# Patient Record
Sex: Female | Born: 1990 | Hispanic: No | Marital: Married | Smoking: Never smoker
Health system: Southern US, Community
[De-identification: ages and names within clinical notes are randomized; demographics above are authoritative.]

## PROBLEM LIST (undated history)

## (undated) ENCOUNTER — Inpatient Hospital Stay (HOSPITAL_COMMUNITY): Payer: Self-pay

---

## 2019-10-26 LAB — OB RESULTS CONSOLE TSH: TSH: 1.05

## 2019-10-26 LAB — SICKLE CELL SCREEN: Sickle Cell Screen: NEGATIVE

## 2019-12-21 LAB — OB RESULTS CONSOLE PLATELET COUNT: Platelets: 233

## 2019-12-21 LAB — OB RESULTS CONSOLE HGB/HCT, BLOOD
HCT: 35 (ref 29–41)
Hemoglobin: 11.9

## 2020-02-03 ENCOUNTER — Other Ambulatory Visit: Payer: Self-pay

## 2020-02-03 ENCOUNTER — Encounter: Payer: Self-pay | Admitting: Certified Nurse Midwife

## 2020-02-03 ENCOUNTER — Ambulatory Visit (INDEPENDENT_AMBULATORY_CARE_PROVIDER_SITE_OTHER): Payer: Self-pay | Admitting: Certified Nurse Midwife

## 2020-02-03 VITALS — BP 109/74 | HR 102 | Ht 64.96 in | Wt 135.5 lb

## 2020-02-03 DIAGNOSIS — Z3A24 24 weeks gestation of pregnancy: Secondary | ICD-10-CM

## 2020-02-03 DIAGNOSIS — Z3400 Encounter for supervision of normal first pregnancy, unspecified trimester: Secondary | ICD-10-CM | POA: Insufficient documentation

## 2020-02-03 DIAGNOSIS — Z3402 Encounter for supervision of normal first pregnancy, second trimester: Secondary | ICD-10-CM

## 2020-02-03 NOTE — Progress Notes (Signed)
History:   Sandra Thomas is a 30 y.o. G1P0000 at [redacted]w[redacted]d by LMP being seen today for her first obstetrical visit.  Her obstetrical history is not significant. Patient does intend to breastfeed. Pregnancy history fully reviewed. Pt moved here on 01/25/20 for to attend a class, she is a pharmacist, FOB is a nurse who will be traveling back and forth between here and [place] until just before the patient delivers. Prefers female providers only.   Patient reports no complaints.  HISTORY: OB History  Gravida Para Term Preterm AB Living  1 0 0 0 0 0  SAB IAB Ectopic Multiple Live Births  0 0 0 0 0    # Outcome Date GA Lbr Len/2nd Weight Sex Delivery Anes PTL Lv  1 Current             Pap is due but will be performed at the postpartum visit  No past medical history on file.  No family history on file. Social History   Tobacco Use  . Smoking status: Never Smoker  . Smokeless tobacco: Never Used  Vaping Use  . Vaping Use: Never used  Substance Use Topics  . Alcohol use: Never  . Drug use: Never   Not on File Current Outpatient Medications on File Prior to Visit  Medication Sig Dispense Refill  . CALCIUM PO Take by mouth.    . Omega-3 Fatty Acids (OMEGA 3 PO) Take by mouth.    . Prenatal Vit-Fe Fumarate-FA (MULTIVITAMIN-PRENATAL) 27-0.8 MG TABS tablet Take 1 tablet by mouth daily at 12 noon.    Marland Kitchen VITAMIN D PO Take by mouth.     No current facility-administered medications on file prior to visit.    Review of Systems Pertinent items noted in HPI and remainder of comprehensive ROS otherwise negative. Physical Exam:   Vitals:   02/03/20 1033 02/03/20 1035  BP: 109/74   Pulse: (!) 102   Weight: 135 lb 8 oz (61.5 kg)   Height:  5' 4.96" (1.65 m)   Fetal Heart Rate (bpm): 148  Uterus:  Fundal Height: 24 cm  Pelvic Exam: Perineum: Pelvic exam deferred   Vulva:    Vagina:     Cervix:    Adnexa:    Bony Pelvis: average  System: General: well-developed,  well-nourished female in no acute distress   Breasts:  normal appearance, no masses or tenderness bilaterally   Skin: normal coloration and turgor, no rashes   Neurologic: oriented, normal, negative, normal mood   Extremities: normal strength, tone, and muscle mass, ROM of all joints is normal   HEENT PERRLA, extraocular movement intact and sclera clear, anicteric   Mouth/Teeth mucous membranes moist, pharynx normal without lesions and dental hygiene good   Neck supple and no masses   Cardiovascular: regular rate and rhythm   Respiratory:  no respiratory distress, normal breath sounds   Abdomen: soft, non-tender; bowel sounds normal; no masses,  no organomegaly    Assessment:    Pregnancy: G1P0000 Patient Active Problem List   Diagnosis Date Noted  . Supervision of low-risk first pregnancy 02/03/2020     Plan:    1. Encounter for supervision of low-risk first pregnancy in second trimester - Initial labs will be drawn at 28wk visit - Continue prenatal vitamins. - Problem list reviewed and updated. - Anticipatory guidance about prenatal visits given including labs, ultrasounds, and testing. - Discussed usage of Babyscripts and virtual visits as additional source of managing and completing prenatal visits  in midst of coronavirus and pandemic.   - Encouraged to complete MyChart Registration for her ability to review results, send requests, and have questions addressed.  - The nature of [place] [facility]Faculty Practice with multiple MDs and Advanced Practice Providers was explained to patient; also emphasized that residents, students are part of our team. - Routine obstetric precautions reviewed. Encouraged to seek out care at office or emergency room [facility] preferred) for urgent and/or emergent concerns. - Reassured patient that we have virtual interpreters available at all times in the hospital - Advised that we are usually able to provide female providers,  but in times of emergency may need a female provider to see her. Pt and FOB agreeable.  2. [redacted] weeks gestation of pregnancy - Genetic Screening discussed, First trimester screen, Quad screen and NIPS: declined. - Ultrasound discussed; fetal anatomic survey: ordered. - [place] MFM OB COMP + 14 WK; Future  Return in about 4 weeks (around 03/02/2020) for IN-PERSON, LOB w GTT.    Edd Arbour, MSN, CNM, IBCLC Certified Nurse Midwife, [facility]

## 2020-02-04 ENCOUNTER — Telehealth: Payer: Self-pay

## 2020-02-04 LAB — POCT URINALYSIS DIP (DEVICE)
Bilirubin Urine: NEGATIVE
Glucose, UA: NEGATIVE mg/dL
Hgb urine dipstick: NEGATIVE
Ketones, ur: NEGATIVE mg/dL
Leukocytes,Ua: NEGATIVE
Nitrite: NEGATIVE
Protein, ur: NEGATIVE mg/dL
Specific Gravity, Urine: 1.02 (ref 1.005–1.030)
Urobilinogen, UA: 0.2 mg/dL (ref 0.0–1.0)
pH: 7 (ref 5.0–8.0)

## 2020-02-04 NOTE — Telephone Encounter (Signed)
Called pt with [place] interpreter Esra ID [phone] ; VM left with anatomy US date and time.

## 2020-02-06 ENCOUNTER — Inpatient Hospital Stay (HOSPITAL_COMMUNITY)
Admission: EM | Admit: 2020-02-06 | Discharge: 2020-02-06 | Disposition: A | Discharge status: Home / Self Care | Payer: Self-pay | Attending: Obstetrics & Gynecology | Admitting: Obstetrics & Gynecology

## 2020-02-06 DIAGNOSIS — Z3A25 25 weeks gestation of pregnancy: Secondary | ICD-10-CM | POA: Insufficient documentation

## 2020-02-06 DIAGNOSIS — O368121 Decreased fetal movements, second trimester, fetus 1: Secondary | ICD-10-CM | POA: Insufficient documentation

## 2020-02-06 DIAGNOSIS — O0932 Supervision of pregnancy with insufficient antenatal care, second trimester: Secondary | ICD-10-CM | POA: Insufficient documentation

## 2020-02-06 NOTE — ED Triage Notes (Signed)
Emergency Medicine Provider OB Triage Evaluation Note  Sandra Thomas is a 30 y.o. female, G1P0000, at [redacted]w[redacted]d gestation who presents to the emergency department with complaints of pelvic pain and decreased fetal movement. History obtained using [place] interpreter.  Review of  Systems  Positive: abdominal pain Negative: vaginal bleeding  Physical Exam  BP 125/83 (BP Location: Right Arm)   Pulse 98   Temp 97.6 F (36.4 C) (Oral)   Resp 16   LMP 08/14/2019   SpO2 100%  General: Awake, no distress  HEENT: Atraumatic  Resp: Normal effort  Cardiac: Normal rate Abd: Nondistended, nontender  MSK: Moves all extremities without difficulty Neuro: Speech clear  Medical Decision Making  Pt evaluated for pregnancy concern and is stable for transfer to MAU. Pt is in agreement with plan for transfer.  7:31 PM Discussed with MAU APP, who accepts patient in transfer. Patient concerned because her niece is waiting in the car. She is asking if we can perform an ultrasound at the bedside to monitor for fetal movement. I told her they will perform this at [facility]. Patient asking [place] to "hurry up and get me there."  Clinical Impression  No diagnosis found.     Dietrich Pates, PA-C 02/06/20 1934

## 2020-02-06 NOTE — MAU Note (Addendum)
Pt transferred from [facility] ED. States she is fine but just wants to hear FH beat. Explained we usually monitor baby on FM for awhile when pt states she has decreased FM. Pt states she feels baby move but just not like usual. States she has been sad today because her husband travels and thinks that is why she has also seen a few drops of vag blood today. Explained that is an additional reason we should monitor baby. Pt told video interpreter that she has had this happen many times in home country and everything was ok. Pt states she is fine and just wants to hear FH tones. FHTs 155. Explained just because their is normal FH rate does not mean that everything is ok. We need to further evaluate to determine fetal wellbeing. Explained if pt wants to leave she will need to sign AMA form. After talking with someone on the phone a few mins., pt wanted to sign AMA and leave. Pt signed AMA and was advised to call doctor this wk for appt. Her next appt is not until February per pt. PT stated she would definitely call her doctor this wk. Pt then left Triage

## 2020-02-17 ENCOUNTER — Other Ambulatory Visit: Payer: Self-pay | Admitting: Certified Nurse Midwife

## 2020-02-17 ENCOUNTER — Ambulatory Visit: Payer: Self-pay | Attending: Certified Nurse Midwife

## 2020-02-17 ENCOUNTER — Other Ambulatory Visit: Payer: Self-pay | Admitting: *Deleted

## 2020-02-17 ENCOUNTER — Other Ambulatory Visit: Payer: Self-pay

## 2020-02-17 DIAGNOSIS — Z3402 Encounter for supervision of normal first pregnancy, second trimester: Secondary | ICD-10-CM

## 2020-02-17 DIAGNOSIS — O0930 Supervision of pregnancy with insufficient antenatal care, unspecified trimester: Secondary | ICD-10-CM

## 2020-03-01 ENCOUNTER — Other Ambulatory Visit: Payer: Self-pay | Admitting: *Deleted

## 2020-03-01 DIAGNOSIS — Z3400 Encounter for supervision of normal first pregnancy, unspecified trimester: Secondary | ICD-10-CM

## 2020-03-02 ENCOUNTER — Ambulatory Visit (INDEPENDENT_AMBULATORY_CARE_PROVIDER_SITE_OTHER): Payer: Self-pay | Admitting: Certified Nurse Midwife

## 2020-03-02 ENCOUNTER — Other Ambulatory Visit: Payer: Self-pay

## 2020-03-02 VITALS — BP 102/69 | HR 85 | Wt 138.2 lb

## 2020-03-02 DIAGNOSIS — Z3400 Encounter for supervision of normal first pregnancy, unspecified trimester: Secondary | ICD-10-CM

## 2020-03-02 DIAGNOSIS — Z3A28 28 weeks gestation of pregnancy: Secondary | ICD-10-CM

## 2020-03-02 DIAGNOSIS — Z3403 Encounter for supervision of normal first pregnancy, third trimester: Secondary | ICD-10-CM

## 2020-03-02 NOTE — Progress Notes (Signed)
   PRENATAL VISIT NOTE  Subjective:  Sandra Thomas is a 30 y.o. G1P0000 at [redacted]w[redacted]d being seen today for ongoing prenatal care.  She is currently monitored for the following issues for this low-risk pregnancy and has Supervision of low-risk first pregnancy and Late prenatal care affecting pregnancy in second trimester on their problem list.  Patient reports no complaints.  Contractions: Not present. Vag. Bleeding: None.  Movement: Present. Denies leaking of fluid.   The following portions of the patient's history were reviewed and updated as appropriate: allergies, current medications, past family history, past medical history, past social history, past surgical history and problem list.   Objective:   Vitals:   03/02/20 0829  BP: 102/69  Pulse: 85  Weight: 138 lb 3.2 oz (62.7 kg)    Fetal Status: Fetal Heart Rate (bpm): 138 Fundal Height: 28 cm Movement: Present     General:  Alert, oriented and cooperative. Patient is in no acute distress.  Skin: Skin is warm and dry. No rash noted.   Cardiovascular: Normal heart rate noted  Respiratory: Normal respiratory effort, no problems with respiration noted  Abdomen: Soft, gravid, appropriate for gestational age.  Pain/Pressure: Absent     Pelvic: Cervical exam deferred        Extremities: Normal range of motion.  Edema: None  Mental Status: Normal mood and affect. Normal behavior. Normal judgment and thought content.   Assessment and Plan:  Pregnancy: G1P0000 at [redacted]w[redacted]d 1. Encounter for supervision of low-risk first pregnancy in third trimester - Pt doing well no complaints, wanted to know why she feels baby stretching her belly side to side - ABO/Rh; Future - Hepatitis B surface antigen - Hepatitis C Antibody - Rubella screen  2. [redacted] weeks gestation of pregnancy - Routine OB care - Baby is breech by Leopold's, advised positioning and stretches to encourage vertex position of baby  Preterm labor symptoms and general  obstetric precautions including but not limited to vaginal bleeding, contractions, leaking of fluid and fetal movement were reviewed in detail with the patient. Please refer to After Visit Summary for other counseling recommendations.   Return in about 2 weeks (around 03/16/2020) for IN-PERSON, LOB.  Future Appointments  Date Time Provider Department Center  03/17/2020  9:00 AM [facility] NURSE [facility]  03/17/2020  9:15 AM [facility] US2 [facility]    Bernerd Limbo, CNM

## 2020-03-03 LAB — CBC
Hematocrit: 35.5 % (ref 34.0–46.6)
Hemoglobin: 11.6 g/dL (ref 11.1–15.9)
MCH: 24.8 pg — ABNORMAL LOW (ref 26.6–33.0)
MCHC: 32.7 g/dL (ref 31.5–35.7)
MCV: 76 fL — ABNORMAL LOW (ref 79–97)
Platelets: 225 10*3/uL (ref 150–450)
RBC: 4.67 x10E6/uL (ref 3.77–5.28)
RDW: 14 % (ref 11.7–15.4)
WBC: 8.4 10*3/uL (ref 3.4–10.8)

## 2020-03-03 LAB — RPR: RPR Ser Ql: NONREACTIVE

## 2020-03-03 LAB — RUBELLA SCREEN: Rubella Antibodies, IGG: 20.3 index (ref >0.99)

## 2020-03-03 LAB — GLUCOSE TOLERANCE, 2 HOURS W/ 1HR
Glucose, 1 hour: 104 mg/dL (ref 65–179)
Glucose, 2 hour: 72 mg/dL (ref 65–152)
Glucose, Fasting: 72 mg/dL (ref 65–91)

## 2020-03-03 LAB — HEPATITIS C ANTIBODY: Hep C Virus Ab: <0.1 s/co ratio (ref 0.0–0.9)

## 2020-03-03 LAB — HEPATITIS B SURFACE ANTIGEN: Hepatitis B Surface Ag: NEGATIVE

## 2020-03-03 LAB — HIV ANTIBODY (ROUTINE TESTING W REFLEX): HIV Screen 4th Generation wRfx: NONREACTIVE

## 2020-03-06 ENCOUNTER — Telehealth: Payer: Self-pay

## 2020-03-06 NOTE — Telephone Encounter (Signed)
Created GFE for patient and sent via mychart.

## 2020-03-13 ENCOUNTER — Telehealth: Payer: Self-pay | Admitting: Family Medicine

## 2020-03-13 NOTE — Telephone Encounter (Signed)
Patient called to say she wanted to cancel her appointment, and she will call us back in about a month. I asked her was she leaving town, and she said no she just needed to cancel.

## 2020-03-16 ENCOUNTER — Encounter: Payer: Self-pay | Admitting: Medical

## 2020-03-17 ENCOUNTER — Ambulatory Visit: Payer: Self-pay

## 2020-03-29 ENCOUNTER — Ambulatory Visit (INDEPENDENT_AMBULATORY_CARE_PROVIDER_SITE_OTHER): Payer: Self-pay | Admitting: Certified Nurse Midwife

## 2020-03-29 ENCOUNTER — Other Ambulatory Visit: Payer: Self-pay

## 2020-03-29 VITALS — BP 113/75 | HR 97 | Wt 143.0 lb

## 2020-03-29 DIAGNOSIS — Z3A32 32 weeks gestation of pregnancy: Secondary | ICD-10-CM

## 2020-03-29 DIAGNOSIS — Z3403 Encounter for supervision of normal first pregnancy, third trimester: Secondary | ICD-10-CM

## 2020-03-29 DIAGNOSIS — N856 Intrauterine synechiae: Secondary | ICD-10-CM | POA: Insufficient documentation

## 2020-03-29 NOTE — Progress Notes (Signed)
   PRENATAL VISIT NOTE  Subjective:  Sandra Thomas is a 30 y.o. G1P0000 at [redacted]w[redacted]d being seen today for ongoing prenatal care.  She is currently monitored for the following issues for this low-risk pregnancy and has Supervision of low-risk first pregnancy and Late prenatal care affecting pregnancy in second trimester on their problem list.  Patient reports no complaints.  Contractions: Irritability. Vag. Bleeding: None.  Movement: Present. Denies leaking of fluid. Interpreter ([place]) present for entirety of visit, pt expressed frustration with her explanations throughout the visit.  The following portions of the patient's history were reviewed and updated as appropriate: allergies, current medications, past family history, past medical history, past social history, past surgical history and problem list.   Objective:   Vitals:   03/29/20 1027  BP: 113/75  Pulse: 97  Weight: 143 lb (64.9 kg)    Fetal Status: Fetal Heart Rate (bpm): 146 Fundal Height: 32 cm Movement: Present     General:  Alert, oriented and cooperative. Patient is in no acute distress.  Skin: Skin is warm and dry. No rash noted.   Cardiovascular: Normal heart rate noted  Respiratory: Normal respiratory effort, no problems with respiration noted  Abdomen: Soft, gravid, appropriate for gestational age.  Pain/Pressure: Present     Pelvic: Cervical exam deferred        Extremities: Normal range of motion.  Edema: None  Mental Status: Normal mood and affect. Normal behavior. Normal judgment and thought content.   Assessment and Plan:  Pregnancy: G1P0000 at [redacted]w[redacted]d 1. Encounter for supervision of low-risk first pregnancy in third trimester - Pt doing well, seems anxious about pregnancy - has very little support in the [place]  2. [redacted] weeks gestation of pregnancy - Routine OB care - Extensive reassurance given about health of baby, HR being normal, movement being vigorous, etc - Anticipatory guidance given about next  visit - Pt requested I be at her birth, explained that I will if I am in the hospital but gave reassurance that she will be well attended by our CNMs if I am not there.   3. Uterine synechiae - Reviewed u/s report with patient, offered genetic testing again. Pt became very upset asking why we keep offering genetic testing (also very upset with interpreter's explanations). Finally able to discern that her concern stems from genetic testing never being offered in her home country unless something is wrong with the baby or the baby has died. Explained repeatedly that we offer it to all pregnant people so we get resources in order prior to delivery, etc. Pt calmed and expressed understanding and relief. - Baby remains breech with head on mom's left side. Recommended follow up ultrasound at 36wks (pt asked to wait until 20mo of pregnancy) for growth and to confirm positioning  Preterm labor symptoms and general obstetric precautions including but not limited to vaginal bleeding, contractions, leaking of fluid and fetal movement were reviewed in detail with the patient. Please refer to After Visit Summary for other counseling recommendations.   Future Appointments  Date Time Provider Department Center  04/12/2020 10:15 AM Bernerd Limbo, CNM [facility]   Edd Arbour, CNM, MSN, IBCLC Certified Nurse Midwife, [facility]

## 2020-03-29 NOTE — Patient Instructions (Signed)
AREA PEDIATRIC/FAMILY PRACTICE PHYSICIANS  [place] ([address]) . [facility] o Chambliss, MD; Eniola, MD; Hale, MD; Hensel, MD; McDiarmid, MD; McIntyer, MD; Neal, MD; Walden, MD o [address] [place], [place] [address] o [phone] o Mon-Fri 8:30-12:30, 1:30-5:00 o Providers come to see babies at [facility] o Accepting Medicaid . [facility] o Limited providers who accept newborns: Koirala, MD; Morrow, MD; Wolters, MD o [address], [place], [place] [address] o [phone] o Mon-Fri 8:00-5:30 o Babies seen by providers at [facility] o Does NOT accept Medicaid o Please call early in hospitalization for appointment (limited availability)  . [facility] o Mulberry, MD o [address] [place], [place] [address] o [phone] o Mon, Tue, Thur, Fri 8:30-5:00, Wed 10:00-7:00 (closed 1-2pm) o Babies seen by [facility] providers o Accepting Medicaid . [facility] Pediatric[facility] o Rubin, MD o [address], [place], [place] [address] o [phone] o Mon-Fri 8:30-5:00, Sat 8:30-12:00 o Provider comes to see babies at [facility] o Accepting Medicaid o Must have been referred from current patients or contacted office prior to delivery . [facility] ([facility]) o Brown, MD; Chandler, MD; Ettefagh, MD; Grant, MD; Lester, MD; McCormick, MD; McQueen, MD; Prose, MD; Simha, MD; Stanley, MD; Stryffeler, NP; Tebben, NP o [address], [place], [place] [address] o [phone] o Mon, Tue, Thur, Fri 8:30-5:30, Wed 9:30-5:30, Sat 8:30-12:30 o Babies seen by [facility] providers o Accepting Medicaid o Only accepting infants of first-time parents or siblings of current patients o Hospital discharge coordinator will make follow-up appointment . Jack Amos o [address],  [place], [place]  [address] o [phone]   Fax - [phone] . [facility] o [address], [place], [place]  [address] o Phone - [phone]   Fax - [phone] . Shilpa Gosrani o [address], [place], [place]  [address] o [phone]  [place] ([address]) . [facility] o Bates, MD; Brassfield, MD; Cooper, Cox, MD; MD; Davis, MD; Dovico, MD; Ettefaugh, MD; Little, MD; Lowe, MD; Keiffer, MD; Melvin, MD; Sumner, MD; Williams, MD o [address], [place], [place] [address] o [phone] o Mon-Fri 8:30-5:00 (extended evenings Mon-Thur as needed), Sat-Sun 10:00-1:00 o Providers come to see babies at [facility] o Accepting Medicaid for families of first-time babies and families with all children in the household age 3 and under. Must register with office prior to making appointment (M-F only). . [facility] o Henson, NP; Knapp, MD; Lalonde, MD; Tysinger, PA o [address] [place], [place] [address] o [phone] o Mon-Fri 8:00-5:00 o Babies seen by providers at [facility] o Does NOT accept Medicaid/Commercial Insurance Only . [facility] ([facility]  o Artis, MD; Barnes, MD; Bratton, MD; Coccaro, MD; Lockett Gardner, MD; Kramer, MD; Marshall, MD; Netherton, MD; Poleto, MD; Skinner, MD o [address] [place], [place] [address] o [phone] o Mon-Fri 8:30-5:30, Sat (Oct.-Mar.) 9:00-1:00 o Babies seen by providers at [facility] o Accepting Medicaid  [place] ([address]) . [facility] o Reid, MD; Warner, MD o [address], ,  [address] o [phone] o Mon-Fri 8:30-5:00, Sat 8:30-12:00 o Providers come to see babies at [facility] o Does NOT accept Medicaid . [facility] o Becker, PA; Hagler, MD; Scifres, PA; Sun, MD; Swayne, MD o [address],  [place], [place] [address] o [phone] o Mon-Fri 8:00-5:00 o Babies seen by providers at [facility] o Does NOT accept Medicaid o Only accepting babies of parents who are patients o Please call early in hospitalization for appointment (limited availability) . [facility] o Clark, MD; Frye, MD; Kelleher, MD; Mack, NP; Miller, MD; O'Keller, MD; Patterson, NP; Pudlo, MD; Puzio, MD; Thomas, MD; Tucker, MD; Twiselton, MD o [address], [place], [place] [address] o [phone] o Mon-Fri 8:00-5:00, Sat 9:00-12:00 o Providers come to see babies at [facility] o Does NOT accept Medicaid  [place] ([address]) . [facility] o Limited providers accepting new patients: Brake, NP; Wharton, PA o [address], [place], [place] [address] o [phone] o Mon-Fri 8:00-5:00 o Babies seen by providers at [facility] o Does NOT accept Medicaid o Only accepting babies of parents who are patients o Please call early in hospitalization for appointment (limited availability) . [facility] o Gay, MD; Quinlan, MD o [address] [place], [place] [address] o [phone] (press 1 to schedule appointment) o Mon-Fri 8:00-5:00 o Providers come to see babies at [facility] o Does NOT accept Medicaid . [facility] o Mazer, MD o [address] [place], [place] [address] o [phone] o Mon-Fri 8:30-5:00 (lunch 12:30-1:00), extended hours by appointment only Wed 5:00-6:30 o Babies seen by [facility] providers o Accepting Medicaid . [facility] o Banks, MD; Jordan, MD; Koberlein, MD o [address], [place], [place] [address] o [phone] o Mon-Fri 8:00-5:00 o Babies seen by [facility] providers o Does NOT accept Medicaid . [facility] o Parker, MD; Hunter, MD; Wallace, DO o [address] [place], [place]  [address] o [phone] o Mon-Fri 8:00-5:00 o Babies seen by [facility] providers o Does NOT accept Medicaid . [facility] o Brandon, PA; Brecken, PA; Christy, NP; Dees, MD; DeClaire, MD; DeWeese, MD; Hansen, NP; Mills, NP; Parrish, NP; Smoot, NP; Summer, MD; Vapne, MD o [address] [place], [place] [address] o [phone] o Mon-Fri 8:30-5:00, Sat 10:00-1:00 o Providers come to see babies at [facility] o Does NOT accept Medicaid o Free prenatal information session Tuesdays at 4:45pm . [facility] o Bouska, MD; Gordon, PA; Jeffery, PA; Weber, PA o [address] [place] [address] o [phone] o Mon-Fri 7:30-5:30 o Babies seen by [facility] providers . [facility] o [address], [place], [place]  [address] o [phone]   Fax - [phone]  [place] ([address] & [address]) . [facility] o [place]ese, MD o [address] [place], [place] [address] o [phone] o Mon-Thur 8:00-6:00 o Providers come to see babies at [facility] o Accepting Medicaid . [facility] o Anderson, NP; Badger, MD; Beal, PA; Spencer, PA o [address] [place], [place] [address] o [phone] o Mon-Thur 7:30-7:30, Fri 7:30-4:30 o Babies seen by [facility] providers o Accepting Medicaid . [facility] o Agbuya, MD; Klett, NP; Romgoolam, MD o [address], ,  [address] o [phone] o Mon-Fri 8:30-5:00, Sat 8:30-12:00 o Providers come to see babies at [facility] o Accepting Medicaid o Must have "Meet & Greet" appointment at office prior to delivery . [facility] o McCord,   MD; Wallace, MD; Wood, MD o [address], [place], [place] [address] o [phone] o Mon-Wed 8:00-6:00, Thur-Fri 8:00-5:00, Sat 9:00-12:00 o Providers come to  see babies at [facility] o Does NOT accept Medicaid o Only accepting siblings of current patients . [facility]  o [address], [place], [place]  [address] o [phone]   Fax - [phone] . [facility] o [address], [place], [place]  [address] o [phone]   Fax - [phone]  [place] ([address]) . [facility] o Cirigliano, DO; Matthews, DO o [address] [place], [place] [address] o [phone] o Mon-Fri 7:00-5:00 o Babies seen by [facility] providers o Does NOT accept Medicaid . [facility] o Briscoe, MD; Howley, PA; Moreira, PA o [address], [place], [place] [address] o [phone] o Mon-Fri 8:00-5:00 o Babies seen by [facility] providers o Accepting Medicaid . [facility] o Boyd, MD; Church, PA; Jones, NP; Osborn, PA o [address], , [place] [address] o [phone] o Mon-Fri 8:00-5:00 o Babies seen by providers at [facility] o Accepting Medicaid  [place] ([address]) . [facility] [place] o Wendling, DO o [address] [place], [place] [address] o [phone] o Mon-Fri 8:00-5:00 o Babies seen by [facility] providers o Does NOT accept Medicaid o Limited availability, please call early in hospitalization to schedule follow-up . [facility] o Calderon, PA; Cummings, MD; Dillard, MD; Martin, PA; Olson, MD; VanDeven, PA o [address], [place], [place] [address] o [phone] o Mon-Fri 8:30-5:00, Sat 9:00-12:00 o Babies seen by providers at [facility] o Accepting Medicaid o Please register online then schedule online or call office o [email or web address] . [facility] o Hunter, NP; Kumar, MD; Martin Rogers, PA o [address], [place], [place] [address] o [phone] o Mon-Fri 8:00-5:00 o Babies seen by providers at [facility] o Accepting Medicaid . [facility] ([facility] o Carrs[place], MD; Kristi Fleenor, NP; West, MD o [address], [place], [place] [address] o [phone] o Mon-Fri 8:00-5:30, Sat&Sun by appointment (phones open at 8:30) o Babies seen by [facility] providers o Accepting Medicaid o Must be a first-time baby or sibling of current patient . [facility] [place]  o [address], [place], [place]  [address] o [phone]   Fax - [phone]  [place] ([address]) . [facility] o Brown, PA; Cowen, PA; Rice, MD; Helton, PA; Spry, MD o [address] [place], [place] [address] o [phone] o Mon-Thur 8:00-7:00, Fri 8:00-5:00, Sat 8:00-12:00, Sun 9:00-12:00 o Babies seen by [facility] providers o Accepting Medicaid . [facility] [place] o Coe-Goins, MD; Marshall, MD; Pierre-Louis, MD o [address] [place] [address], [place], [place] [address] o [phone] o Mon-Thur 8:00-5:00 o Babies seen by providers at [facility] o Accepting Medicaid . [facility] o Bratton, MD; Coe-Goins, MD; Hayes, MD; Lewis, MD; List, MD; Lott, MD; Marshall, MD; Moran, MD; O'Neal, MD; Pierre-Louis, MD; Pitonzo, MD; Scholer, MD; Spangle, MD o [address] [place], [place]   [address] o [phone] o Mon-Fri 8:00-5:30, Sat (Oct.-Mar.) 9:00-1:00 o Babies seen by providers at [facility] o Accepting Medicaid o Must fill out new patient packet, available online at [email or web address] . [facility] ([facility] o Friddle, NP; Harris, NP; Kelly, NP; Logan, MD;  Melvin, PA; Poth, MD; Ramadoss, MD; Stanton, NP o [address], [place], [place] [address] o [phone] o Mon-Thur 8:00-5:30, Fri 8:00-5:00 o Babies seen by providers at [facility] o Accepting Medicaid  [place] ([address]) . [facility] o Dixon, PA; Enoch, MD; Pickard, MD; Tapia, PA o [address], [place], [place] [address] o [phone] o Mon-Fri 8:00-5:00 o Babies seen by providers at [facility] o Accepting Medicaid   [place] ([address]) . [facility] [place] o Masneri, DO; Meyers, MD; Nelson, PA o [address], [place], [place] [address] o [phone] o Mon-Fri 8:00-5:00 o Babies seen by providers at [facility] o Does NOT accept Medicaid o Limited appointment availability, please call early in hospitalization  . [facility] [place] o Kunedd, DO; McGowen, MD o [address], [place], [place] [address] o [phone] o Mon-Fri 8:00-5:00 o Babies seen by [facility] providers o Does NOT accept Medicaid . [facility] [place] o Cameron, MD; MacDonald, MD; Michaels, PA; Nayak, MD o [address] [place] [address], [place], [place] [address] o [phone] o Mon-Fri 8:00-5:00 o After hours clinic ([address] [place], [place] [address]) [phone] Mon-Fri 5:00-8:00, Sat 12:00-6:00, Sun 10:00-4:00 o Babies seen by [facility] providers o Accepting Medicaid . [facility] [place] o [address], [place], [place]  [address] o [phone]   Fax - [phone]  [place] ([address]) .  [facility] [place] [facility] o Andy, MD o [address], [place], [place] [address] o [phone] o Mon-Fri 8:00-5:00 o Babies seen by [facility] providers o Does NOT accept Medicaid . [facility] [place] ([facility] [place]) o Eksir, MD o [address], [place], [place]  [address] o [phone] o Mon-Thur 8:00-7:00, Fri 8:00-5:00, Sat 8:00-12:00 o Babies seen by providers at [facility] o Accepting Medicaid - but does not have vaccinations in office (must be received elsewhere) o Limited availability, please call early in hospitalization  [place] ([address]) . [facility]  o Charlene Flemming, MD o [address], [place] [address] o [phone]  Fax [phone]   

## 2020-04-11 ENCOUNTER — Encounter: Payer: Self-pay | Admitting: General Practice

## 2020-04-12 ENCOUNTER — Encounter: Payer: Self-pay | Admitting: Certified Nurse Midwife

## 2020-04-14 ENCOUNTER — Other Ambulatory Visit: Payer: Self-pay | Admitting: Maternal & Fetal Medicine

## 2020-04-14 DIAGNOSIS — Z363 Encounter for antenatal screening for malformations: Secondary | ICD-10-CM

## 2020-04-20 ENCOUNTER — Ambulatory Visit: Payer: Self-pay

## 2020-04-24 ENCOUNTER — Encounter: Payer: Self-pay | Admitting: *Deleted

## 2020-04-24 ENCOUNTER — Other Ambulatory Visit: Payer: Self-pay

## 2020-04-24 ENCOUNTER — Ambulatory Visit: Payer: Self-pay | Attending: Maternal & Fetal Medicine | Admitting: *Deleted

## 2020-04-24 ENCOUNTER — Ambulatory Visit (HOSPITAL_BASED_OUTPATIENT_CLINIC_OR_DEPARTMENT_OTHER): Payer: Medicaid Other

## 2020-04-24 ENCOUNTER — Other Ambulatory Visit: Payer: Self-pay | Admitting: *Deleted

## 2020-04-24 DIAGNOSIS — O403XX0 Polyhydramnios, third trimester, not applicable or unspecified: Secondary | ICD-10-CM | POA: Insufficient documentation

## 2020-04-24 DIAGNOSIS — Z3A36 36 weeks gestation of pregnancy: Secondary | ICD-10-CM | POA: Insufficient documentation

## 2020-04-24 DIAGNOSIS — O0932 Supervision of pregnancy with insufficient antenatal care, second trimester: Secondary | ICD-10-CM

## 2020-04-24 DIAGNOSIS — O409XX0 Polyhydramnios, unspecified trimester, not applicable or unspecified: Secondary | ICD-10-CM

## 2020-04-24 DIAGNOSIS — O0933 Supervision of pregnancy with insufficient antenatal care, third trimester: Secondary | ICD-10-CM | POA: Insufficient documentation

## 2020-04-24 DIAGNOSIS — Z363 Encounter for antenatal screening for malformations: Secondary | ICD-10-CM | POA: Diagnosis not present

## 2020-04-26 ENCOUNTER — Ambulatory Visit (INDEPENDENT_AMBULATORY_CARE_PROVIDER_SITE_OTHER): Payer: Medicaid Other | Admitting: Certified Nurse Midwife

## 2020-04-26 ENCOUNTER — Other Ambulatory Visit (HOSPITAL_COMMUNITY)
Admission: RE | Admit: 2020-04-26 | Discharge: 2020-04-26 | Disposition: A | Discharge status: Home / Self Care | Payer: Self-pay | Source: Ambulatory Visit | Attending: Certified Nurse Midwife | Admitting: Certified Nurse Midwife

## 2020-04-26 ENCOUNTER — Other Ambulatory Visit: Payer: Self-pay

## 2020-04-26 VITALS — BP 122/86 | HR 92 | Wt 149.8 lb

## 2020-04-26 DIAGNOSIS — O0932 Supervision of pregnancy with insufficient antenatal care, second trimester: Secondary | ICD-10-CM

## 2020-04-26 DIAGNOSIS — O321XX0 Maternal care for breech presentation, not applicable or unspecified: Secondary | ICD-10-CM

## 2020-04-26 DIAGNOSIS — Z3403 Encounter for supervision of normal first pregnancy, third trimester: Secondary | ICD-10-CM

## 2020-04-26 DIAGNOSIS — Z23 Encounter for immunization: Secondary | ICD-10-CM

## 2020-04-26 DIAGNOSIS — Z3A36 36 weeks gestation of pregnancy: Secondary | ICD-10-CM

## 2020-04-26 DIAGNOSIS — N856 Intrauterine synechiae: Secondary | ICD-10-CM

## 2020-04-27 LAB — GC/CHLAMYDIA PROBE AMP (~~LOC~~) NOT AT ARMC
Chlamydia: NEGATIVE
Comment: NEGATIVE
Comment: NORMAL
Neisseria Gonorrhea: NEGATIVE

## 2020-04-28 NOTE — Progress Notes (Signed)
   PRENATAL VISIT NOTE  Subjective:  Sandra Thomas is a 30 y.o. G1P0000 at [redacted]w[redacted]d being seen today for ongoing prenatal care.  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  Patient reports no complaints.  Contractions: Not present. Vag. Bleeding: None.  Movement: Present. Denies leaking of fluid.   The following portions of the patient's history were reviewed and updated as appropriate: allergies, current medications, past family history, past medical history, past social history, past surgical history and problem list.   Objective:   Vitals:   04/26/20 1011  BP: 122/86  Pulse: 92  Weight: 149 lb 12.8 oz (67.9 kg)    Fetal Status: Fetal Heart Rate (bpm): 152 Fundal Height: 36 cm Movement: Present  Presentation: Complete Breech  General:  Alert, oriented and cooperative. Patient is in no acute distress.  Skin: Skin is warm and dry. No rash noted.   Cardiovascular: Normal heart rate noted  Respiratory: Normal respiratory effort, no problems with respiration noted  Abdomen: Soft, gravid, appropriate for gestational age.  Pain/Pressure: Absent     Pelvic: Cervical exam deferred       Pt did not tolerate swab collection well, very anxious and tender to very light touch  Extremities: Normal range of motion.  Edema: None  Mental Status: Normal mood and affect. Normal behavior. Normal judgment and thought content.   Assessment and Plan:  Pregnancy: G1P0000 at [redacted]w[redacted]d 1. Encounter for supervision of low-risk first pregnancy in third trimester - Pt doing well overall, feeling fetal movement  2. [redacted] weeks gestation of pregnancy - Routine OB care - Culture, beta strep (group b only) - Tdap vaccine greater than or equal to 7yo IM - GC/Chlamydia probe amp ([place])not at [facility]  3. Breech  presentation, single or unspecified fetus - Noted by Leopold's, confirmed by bedside U/S. Explained possible delivery options to patient who would like to avoid surgical delivery. - Reviewed ECV, will book next appt with Dr. Shawnie Pons for evaluation and discussion of ECV.  4. Uterine synechiae - Noted on last U/S, uterus measures appropriately but does seem tight around baby. AFI normal on previous U/S as well  Preterm labor symptoms and general obstetric precautions including but not limited to vaginal bleeding, contractions, leaking of fluid and fetal movement were reviewed in detail with the patient. Please refer to After Visit Summary for other counseling recommendations.   Return in about 1 week (around 05/03/2020) for IN-PERSON, LOB.  Future Appointments  Date Time Provider Department Center  05/04/2020  8:15 AM Reva Bores, MD [facility]  05/15/2020  9:15 AM [facility] NURSE [facility]  05/15/2020  9:30 AM [facility]3 [facility]   Edd Arbour, CNM, MSN, IBCLC Certified Nurse Midwife, [facility]

## 2020-04-30 LAB — CULTURE, BETA STREP (GROUP B ONLY): Strep Gp B Culture: NEGATIVE

## 2020-05-04 ENCOUNTER — Other Ambulatory Visit: Payer: Self-pay

## 2020-05-04 ENCOUNTER — Ambulatory Visit (INDEPENDENT_AMBULATORY_CARE_PROVIDER_SITE_OTHER): Payer: Self-pay | Admitting: Family Medicine

## 2020-05-04 VITALS — BP 121/83 | HR 87 | Wt 151.9 lb

## 2020-05-04 DIAGNOSIS — O321XX0 Maternal care for breech presentation, not applicable or unspecified: Secondary | ICD-10-CM

## 2020-05-04 DIAGNOSIS — Z3403 Encounter for supervision of normal first pregnancy, third trimester: Secondary | ICD-10-CM

## 2020-05-04 DIAGNOSIS — N856 Intrauterine synechiae: Secondary | ICD-10-CM

## 2020-05-04 NOTE — Progress Notes (Signed)
   PRENATAL VISIT NOTE  Subjective:  Sandra Thomas is a 30 y.o. G1P0000 at [redacted]w[redacted]d being seen today for ongoing prenatal care.  She is currently monitored for the following issues for this low-risk pregnancy and has Supervision of low-risk first pregnancy; Late prenatal care affecting pregnancy in second trimester; Uterine synechiae; and Breech presentation, antepartum on their problem list.  Patient reports no complaints.  Contractions: Not present. Vag. Bleeding: None.  Movement: Present. Denies leaking of fluid.   The following portions of the patient's history were reviewed and updated as appropriate: allergies, current medications, past family history, past medical history, past social history, past surgical history and problem list.   Objective:   Vitals:   05/04/20 0820  BP: 121/83  Pulse: 87  Weight: 151 lb 14.4 oz (68.9 kg)    Fetal Status: Fetal Heart Rate (bpm): 140   Movement: Present     General:  Alert, oriented and cooperative. Patient is in no acute distress.  Skin: Skin is warm and dry. No rash noted.   Cardiovascular: Normal heart rate noted  Respiratory: Normal respiratory effort, no problems with respiration noted  Abdomen: Soft, gravid, appropriate for gestational age.  Pain/Pressure: Absent     Pelvic: Cervical exam deferred        Extremities: Normal range of motion.  Edema: None  Mental Status: Normal mood and affect. Normal behavior. Normal judgment and thought content.   Assessment and Plan:  Pregnancy: G1P0000 at [redacted]w[redacted]d 1. Encounter for supervision of low-risk first pregnancy in third trimester Continue routine prenatal care.  2. Breech presentation with antenatal problem, single or unspecified fetus Given size of baby and uterine synechiae/septum her success rate is super low. Offered attempts but her husband declined this. 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  3. Uterine synechiae Still present and likely to be a barrier to ECV.  Term labor symptoms and general obstetric precautions including but not limited to vaginal bleeding, contractions, leaking of fluid and fetal movement were reviewed in detail with the patient. Please refer to After Visit Summary for other counseling recommendations.   Return in 1 week (on 05/11/2020) for [facility].  Future Appointments  Date Time Provider Department Center  05/15/2020  9:15 AM [facility] NURSE [facility]  05/15/2020  9:30 AM [facility] US3 [facility]    Reva Bores, MD

## 2020-05-04 NOTE — Patient Instructions (Signed)

## 2020-05-04 NOTE — Progress Notes (Signed)
[facility]- in person interpreter.

## 2020-05-12 ENCOUNTER — Other Ambulatory Visit: Payer: Self-pay

## 2020-05-12 ENCOUNTER — Telehealth: Payer: Self-pay | Admitting: *Deleted

## 2020-05-12 ENCOUNTER — Ambulatory Visit (INDEPENDENT_AMBULATORY_CARE_PROVIDER_SITE_OTHER): Payer: Self-pay | Admitting: Obstetrics and Gynecology

## 2020-05-12 ENCOUNTER — Encounter: Payer: Self-pay | Admitting: *Deleted

## 2020-05-12 VITALS — BP 125/82 | HR 80 | Wt 148.8 lb

## 2020-05-12 DIAGNOSIS — Z3403 Encounter for supervision of normal first pregnancy, third trimester: Secondary | ICD-10-CM

## 2020-05-12 DIAGNOSIS — O321XX0 Maternal care for breech presentation, not applicable or unspecified: Secondary | ICD-10-CM

## 2020-05-12 DIAGNOSIS — Z3A38 38 weeks gestation of pregnancy: Secondary | ICD-10-CM | POA: Insufficient documentation

## 2020-05-12 DIAGNOSIS — N856 Intrauterine synechiae: Secondary | ICD-10-CM

## 2020-05-12 NOTE — Telephone Encounter (Signed)
Call to patient with [facility] [place] [phone]. Advised c- section is scheduled for 05-20-20 at 0930, arrive 0730. Patient replied that she cant give answer now, husband is busy and they need two days to decide.  Advised will keep scheduled as is and check with her on Monday. Call back number given. Advised MD feels very important to proceed before she begins labor. Patient replied that doctor has explained that and she will let us know on Monday.   Phone update provided to Dr Donavan Foil.

## 2020-05-12 NOTE — Progress Notes (Signed)
   PRENATAL VISIT NOTE  Subjective:  Sandra Thomas is a 30 y.o. G1P0000 at [redacted]w[redacted]d being seen today for ongoing prenatal care.  She is currently monitored for the following issues for this high-risk pregnancy and has Supervision of low-risk first pregnancy; Late prenatal care affecting pregnancy in second trimester; Uterine synechiae; Breech presentation, antepartum; and [redacted] weeks gestation of pregnancy on their problem list.  Patient doing well with no acute concerns today. She reports no complaints.  Contractions: Not present. Vag. Bleeding: None.  Movement: Present. Denies leaking of fluid.   The following portions of the patient's history were reviewed and updated as appropriate: allergies, current medications, past family history, past medical history, past social history, past surgical history and problem list. Problem list updated.  Objective:   Vitals:   05/12/20 0906  BP: 125/82  Pulse: 80  Weight: 148 lb 12.8 oz (67.5 kg)    Fetal Status: Fetal Heart Rate (bpm): 138   Movement: Present     General:  Alert, oriented and cooperative. Patient is in no acute distress.  Skin: Skin is warm and dry. No rash noted.   Cardiovascular: Normal heart rate noted  Respiratory: Normal respiratory effort, no problems with respiration noted  Abdomen: Soft, gravid, appropriate for gestational age.  Pain/Pressure: Absent     Pelvic: Cervical exam deferred        Extremities: Normal range of motion.  Edema: None  Mental Status:  Normal mood and affect. Normal behavior. Normal judgment and thought content.   Leopolds and bedside u/s confirm  Persistent breech presentation Assessment and Plan:  Pregnancy: G1P0000 at [redacted]w[redacted]d  1. Encounter for supervision of low-risk first pregnancy in third trimester Pt declined ob visit next week when offered.  2. [redacted] weeks gestation of pregnancy   3. Breech presentation with antenatal problem, single or unspecified fetus Breech confirmed.   Discussed primary c section at 39.5 weeks.  Pt declined for family and cultural reasons.  Discussed goal is delivery before she goes into labor.  Pt compromised for c section at 40 weeks when her spouse will be back in town.  Surgery request has been sent.  4. Uterine synechiae   Term labor symptoms and general obstetric precautions including but not limited to vaginal bleeding, contractions, leaking of fluid and fetal movement were reviewed in detail with the patient.  Please refer to After Visit Summary for other counseling recommendations.   No follow-ups on file.   Mariel Aloe, MD Faculty Attending Center for [facility]

## 2020-05-12 NOTE — Patient Instructions (Signed)
Cesarean Delivery Cesarean birth, or cesarean delivery, is the surgical delivery of a baby through an incision in the abdomen and the uterus. This may be referred to as a C-section. This procedure may be scheduled ahead of time, or it may be done in an emergency situation. Tell a health care provider about:  Any allergies you have.  All medicines you are taking, including vitamins, herbs, eye drops, creams, and over-the-counter medicines.  Any problems you or family members have had with anesthetic medicines.  Any blood disorders you have.  Any surgeries you have had.  Any medical conditions you have.  Whether you or any members of your family have a history of deep vein thrombosis (DVT) or pulmonary embolism (PE). What are the risks? Generally, this is a safe procedure. However, problems may occur, including:  Infection.  Bleeding.  Allergic reactions to medicines.  Damage to other structures or organs.  Blood clots.  Injury to your baby. What happens before the procedure? General instructions  Follow instructions from your health care provider about eating or drinking restrictions.  If you know that you are going to have a cesarean delivery, do not shave your pubic area. Shaving before the procedure may increase your risk of infection.  Plan to have someone take you home from the hospital.  Ask your health care provider what steps will be taken to prevent infection. These may include: ? Removing hair at the surgery site. ? Washing skin with a germ-killing soap. ? Taking antibiotic medicine.  Depending on the reason for your cesarean delivery, you may have a physical exam or additional testing, such as an ultrasound.  You may have your blood or urine tested. Questions for your health care provider  Ask your health care provider about: ? Changing or stopping your regular medicines. This is especially important if you are taking diabetes medicines or blood  thinners. ? Your pain management plan. This is especially important if you plan to breastfeed your baby. ? How long you will be in the hospital after the procedure. ? Any concerns you may have about receiving blood products, if you need them during the procedure. ? Cord blood banking, if you plan to collect your baby's umbilical cord blood.  You may also want to ask your health care provider: ? Whether you will be able to hold or breastfeed your baby while you are still in the operating room. ? Whether your baby can stay with you immediately after the procedure and during your recovery. ? Whether a family member or a person of your choice can go with you into the operating room and stay with you during the procedure, immediately after the procedure, and during your recovery. What happens during the procedure?  An IV will be inserted into one of your veins.  Fluid and medicines, such as antibiotics, will be given before the surgery.  Fetal monitors will be placed on your abdomen to check your baby's heart rate.  You may be given a special warming gown to wear to keep your temperature stable.  A catheter may be inserted into your bladder through your urethra. This drains your urine during the procedure.  You may be given one or more of the following: ? A medicine to numb the area (local anesthetic). ? A medicine to make you fall asleep (general anesthetic). ? A medicine (regional anesthetic) that is injected into your back or through a small thin tube placed in your back (spinal anesthetic or epidural anesthetic). This   numbs everything below the injection site and allows you to stay awake during your procedure. If this makes you feel nauseous, tell your health care provider. Medicines will be available to help reduce any nausea you may feel.  An incision will be made in your abdomen, and then in your uterus.  If you are awake during your procedure, you may feel tugging and pulling in your  abdomen, but you should not feel pain. If you feel pain, tell your health care provider immediately.  Your baby will be removed from your uterus. You may feel more pressure or pushing while this happens.  Immediately after birth, your baby will be dried and kept warm. You may be able to hold and breastfeed your baby.  The umbilical cord may be clamped and cut during this time. This usually occurs after waiting a period of 1-2 minutes after delivery.  Your placenta will be removed from your uterus.  Your incisions will be closed with stitches (sutures). Staples, skin glue, or adhesive strips may also be applied to the incision in your abdomen.  Bandages (dressings) may be placed over the incision in your abdomen. The procedure may vary among health care providers and hospitals.   What happens after the procedure?  Your blood pressure, heart rate, breathing rate, and blood oxygen level will be monitored until you are discharged from the hospital.  You may continue to receive fluids and medicines through an IV.  You will have some pain. Medicines will be available to help control your pain.  To help prevent blood clots: ? You may be given medicines. ? You may have to wear compression stockings or devices. ? You will be encouraged to walk around when you are able.  Hospital staff will encourage and support bonding with your baby. Your hospital may have you and your baby to stay in the same room (rooming in) during your hospital stay to encourage successful bonding and breastfeeding.  You may be encouraged to cough and breathe deeply often. This helps to prevent lung problems.  If you have a catheter draining your urine, it will be removed as soon as possible after your procedure. Summary  Cesarean birth, or cesarean delivery, is the surgical delivery of a baby through an incision in the abdomen and the uterus.  Follow instructions from your health care provider about eating or  drinking restrictions before the procedure.  You will have some pain after the procedure. Medicines will be available to help control your pain.  Hospital staff will encourage and support bonding with your baby after the procedure. Your hospital may have you and your baby to stay in the same room (rooming in) during your hospital stay to encourage successful bonding and breastfeeding. This information is not intended to replace advice given to you by your health care provider. Make sure you discuss any questions you have with your health care provider. Document Revised: 09/06/2019 Document Reviewed: 07/14/2017 Elsevier Patient Education  2021 Elsevier Inc.   

## 2020-05-15 ENCOUNTER — Encounter (HOSPITAL_COMMUNITY): Payer: Self-pay

## 2020-05-15 ENCOUNTER — Ambulatory Visit: Payer: Self-pay

## 2020-05-15 ENCOUNTER — Encounter: Payer: Self-pay | Admitting: Family Medicine

## 2020-05-15 NOTE — Pre-Procedure Instructions (Signed)
Interpreter number [phone]

## 2020-05-15 NOTE — Patient Instructions (Signed)
Sandra Thomas  05/15/2020   Your procedure is scheduled on:  05/20/2020  Arrive at 0730 at Entrance C on [address] at [facility]  and [employer]. You are invited to use the FREE valet parking or use the Visitor's parking deck.  Pick up the phone at the desk and dial [phone].  Call this number if you have problems the morning of surgery: [phone]  Remember:   Do not eat food:(After Midnight) Desps de medianoche.  Do not drink clear liquids: (After Midnight) Desps de medianoche.  Take these medicines the morning of surgery with A SIP OF WATER:  none   Do not wear jewelry, make-up or nail polish.  Do not wear lotions, powders, or perfumes. Do not wear deodorant.  Do not shave 48 hours prior to surgery.  Do not bring valuables to the hospital.  [facility] is not   responsible for any belongings or valuables brought to the hospital.  Contacts, dentures or bridgework may not be worn into surgery.  Leave suitcase in the car. After surgery it may be brought to your room.  For patients admitted to the hospital, checkout time is 11:00 AM the day of              discharge.      Please read over the following fact sheets that you were given:     Preparing for Surgery

## 2020-05-15 NOTE — Pre-Procedure Instructions (Signed)
Pt understands the provider Saturday will be female.  Offered to reschedule to Friday for female.  Pt chose to keep the 4/30 plan.  She and her husband are aware of provider.

## 2020-05-15 NOTE — Telephone Encounter (Signed)
Message received from Hardie Pulley RN L&D, patient requested female physician. Advised female is available on 4-29.  Per Lauris Poag, patient completed pre-op screening and confirms 05-20-20 as scheduled.   Encounter closed.

## 2020-05-16 NOTE — Anesthesia Preprocedure Evaluation (Addendum)
Anesthesia Evaluation  Patient identified by MRN, date of birth, ID band Patient awake    Reviewed: Allergy & Precautions, NPO status , Patient's Chart, lab work & pertinent test results  History of Anesthesia Complications Negative for: history of anesthetic complications  Airway Mallampati: II  TM Distance: >3 FB Neck ROM: Full    Dental no notable dental hx.    Pulmonary neg pulmonary ROS,    Pulmonary exam normal        Cardiovascular negative cardio ROS Normal cardiovascular exam     Neuro/Psych negative neurological ROS  negative psych ROS   GI/Hepatic negative GI ROS, Neg liver ROS,   Endo/Other  negative endocrine ROS  Renal/GU negative Renal ROS  negative genitourinary   Musculoskeletal negative musculoskeletal ROS (+)   Abdominal   Peds  Hematology negative hematology ROS (+)   Anesthesia Other Findings Day of surgery medications reviewed with patient.  Reproductive/Obstetrics (+) Pregnancy (breech presentation)                            Anesthesia Physical Anesthesia Plan  ASA: II  Anesthesia Plan: Spinal   Post-op Pain Management:    Induction:   PONV Risk Score and Plan: 4 or greater and Treatment may vary due to age or medical condition, Ondansetron and Dexamethasone  Airway Management Planned: Natural Airway  Additional Equipment: None  Intra-op Plan:   Post-operative Plan:   Informed Consent: I have reviewed the patients History and Physical, chart, labs and discussed the procedure including the risks, benefits and alternatives for the proposed anesthesia with the patient or authorized representative who has indicated his/her understanding and acceptance.     Interpreter used for [employer] Discussed with: CRNA  Anesthesia Plan Comments:        Anesthesia Quick Evaluation

## 2020-05-18 ENCOUNTER — Other Ambulatory Visit: Payer: Self-pay

## 2020-05-18 ENCOUNTER — Encounter (HOSPITAL_COMMUNITY)
Admission: RE | Admit: 2020-05-18 | Discharge: 2020-05-18 | Disposition: A | Discharge status: Home / Self Care | Payer: Self-pay | Source: Ambulatory Visit | Attending: Obstetrics and Gynecology | Admitting: Obstetrics and Gynecology

## 2020-05-18 ENCOUNTER — Other Ambulatory Visit (HOSPITAL_COMMUNITY)
Admission: RE | Admit: 2020-05-18 | Discharge: 2020-05-18 | Disposition: A | Discharge status: Home / Self Care | Payer: Self-pay | Source: Ambulatory Visit | Attending: Obstetrics and Gynecology | Admitting: Obstetrics and Gynecology

## 2020-05-18 DIAGNOSIS — Z01818 Encounter for other preprocedural examination: Secondary | ICD-10-CM | POA: Diagnosis not present

## 2020-05-18 DIAGNOSIS — Z20822 Contact with and (suspected) exposure to covid-19: Secondary | ICD-10-CM | POA: Insufficient documentation

## 2020-05-18 LAB — TYPE AND SCREEN
ABO/RH(D): A POS
Antibody Screen: NEGATIVE

## 2020-05-18 LAB — CBC
HCT: 37 % (ref 36.0–46.0)
Hemoglobin: 11.6 g/dL — ABNORMAL LOW (ref 12.0–15.0)
MCH: 24.5 pg — ABNORMAL LOW (ref 26.0–34.0)
MCHC: 31.4 g/dL (ref 30.0–36.0)
MCV: 78.1 fL — ABNORMAL LOW (ref 80.0–100.0)
Platelets: 263 10*3/uL (ref 150–400)
RBC: 4.74 MIL/uL (ref 3.87–5.11)
RDW: 14.8 % (ref 11.5–15.5)
WBC: 8.3 10*3/uL (ref 4.0–10.5)
nRBC: 0 % (ref 0.0–0.2)

## 2020-05-18 LAB — SARS CORONAVIRUS 2 (TAT 6-24 HRS): SARS Coronavirus 2: NEGATIVE

## 2020-05-19 LAB — RPR: RPR Ser Ql: NONREACTIVE

## 2020-05-20 ENCOUNTER — Inpatient Hospital Stay (HOSPITAL_COMMUNITY): Payer: Medicaid Other | Admitting: Anesthesiology

## 2020-05-20 ENCOUNTER — Encounter (HOSPITAL_COMMUNITY)
Admission: RE | Disposition: A | Discharge status: Home / Self Care | Payer: Self-pay | Source: Ambulatory Visit | Attending: Obstetrics and Gynecology

## 2020-05-20 ENCOUNTER — Other Ambulatory Visit: Payer: Self-pay

## 2020-05-20 ENCOUNTER — Encounter (HOSPITAL_COMMUNITY): Payer: Self-pay | Admitting: Obstetrics and Gynecology

## 2020-05-20 ENCOUNTER — Inpatient Hospital Stay (HOSPITAL_COMMUNITY)
Admission: RE | Admit: 2020-05-20 | Discharge: 2020-05-22 | DRG: 788 | Disposition: A | Discharge status: Home / Self Care | Payer: Medicaid Other | Source: Ambulatory Visit | Attending: Obstetrics and Gynecology | Admitting: Obstetrics and Gynecology

## 2020-05-20 DIAGNOSIS — O329XX0 Maternal care for malpresentation of fetus, unspecified, not applicable or unspecified: Secondary | ICD-10-CM | POA: Diagnosis present

## 2020-05-20 DIAGNOSIS — O26893 Other specified pregnancy related conditions, third trimester: Secondary | ICD-10-CM | POA: Diagnosis present

## 2020-05-20 DIAGNOSIS — Z20822 Contact with and (suspected) exposure to covid-19: Secondary | ICD-10-CM | POA: Diagnosis present

## 2020-05-20 DIAGNOSIS — O3663X0 Maternal care for excessive fetal growth, third trimester, not applicable or unspecified: Secondary | ICD-10-CM | POA: Diagnosis present

## 2020-05-20 DIAGNOSIS — D649 Anemia, unspecified: Secondary | ICD-10-CM | POA: Diagnosis not present

## 2020-05-20 DIAGNOSIS — Z3403 Encounter for supervision of normal first pregnancy, third trimester: Secondary | ICD-10-CM

## 2020-05-20 DIAGNOSIS — O0932 Supervision of pregnancy with insufficient antenatal care, second trimester: Secondary | ICD-10-CM

## 2020-05-20 DIAGNOSIS — O321XX0 Maternal care for breech presentation, not applicable or unspecified: Principal | ICD-10-CM | POA: Diagnosis present

## 2020-05-20 DIAGNOSIS — Z3A40 40 weeks gestation of pregnancy: Secondary | ICD-10-CM

## 2020-05-20 DIAGNOSIS — O358XX0 Maternal care for other (suspected) fetal abnormality and damage, not applicable or unspecified: Secondary | ICD-10-CM | POA: Diagnosis present

## 2020-05-20 DIAGNOSIS — O9903 Anemia complicating the puerperium: Secondary | ICD-10-CM | POA: Diagnosis not present

## 2020-05-20 DIAGNOSIS — O403XX0 Polyhydramnios, third trimester, not applicable or unspecified: Secondary | ICD-10-CM | POA: Diagnosis present

## 2020-05-20 LAB — CBC
HCT: 31.6 % — ABNORMAL LOW (ref 36.0–46.0)
Hemoglobin: 10.2 g/dL — ABNORMAL LOW (ref 12.0–15.0)
MCH: 25.1 pg — ABNORMAL LOW (ref 26.0–34.0)
MCHC: 32.3 g/dL (ref 30.0–36.0)
MCV: 77.8 fL — ABNORMAL LOW (ref 80.0–100.0)
Platelets: 196 10*3/uL (ref 150–400)
RBC: 4.06 MIL/uL (ref 3.87–5.11)
RDW: 14.6 % (ref 11.5–15.5)
WBC: 14 10*3/uL — ABNORMAL HIGH (ref 4.0–10.5)
nRBC: 0 % (ref 0.0–0.2)

## 2020-05-20 LAB — CREATININE, SERUM
Creatinine, Ser: 0.45 mg/dL (ref 0.44–1.00)
GFR, Estimated: >60 mL/min (ref >60)

## 2020-05-20 SURGERY — Surgical Case
Anesthesia: Spinal

## 2020-05-20 MED ORDER — SIMETHICONE 80 MG PO CHEW
80.0000 mg | CHEWABLE_TABLET | Freq: Three times a day (TID) | ORAL | Status: DC
  Administered 2020-05-20 – 2020-05-22 (×6): 80 mg via ORAL
  Filled 2020-05-20 (×6): qty 1

## 2020-05-20 MED ORDER — LACTATED RINGERS IV SOLN: INTRAVENOUS | Status: DC | PRN

## 2020-05-20 MED ORDER — BUPIVACAINE IN DEXTROSE 0.75-8.25 % IT SOLN
INTRATHECAL | Status: DC | PRN
  Administered 2020-05-20: 1.6 mL via INTRATHECAL

## 2020-05-20 MED ORDER — NALBUPHINE HCL 10 MG/ML IJ SOLN: 5.0000 mg | Freq: Once | INTRAMUSCULAR | Status: DC | PRN

## 2020-05-20 MED ORDER — OXYTOCIN-SODIUM CHLORIDE 30-0.9 UT/500ML-% IV SOLN
INTRAVENOUS | Status: AC
  Filled 2020-05-20: qty 500

## 2020-05-20 MED ORDER — NALBUPHINE HCL 10 MG/ML IJ SOLN
5.0000 mg | INTRAMUSCULAR | Status: DC | PRN
Start: 2020-05-20 — End: 2020-05-23

## 2020-05-20 MED ORDER — DEXAMETHASONE SODIUM PHOSPHATE 10 MG/ML IJ SOLN
INTRAMUSCULAR | Status: DC | PRN
  Administered 2020-05-20: 10 mg via INTRAVENOUS

## 2020-05-20 MED ORDER — PRENATAL MULTIVITAMIN CH: 1.0000 | ORAL_TABLET | Freq: Every day | ORAL | Status: DC

## 2020-05-20 MED ORDER — DEXAMETHASONE SODIUM PHOSPHATE 10 MG/ML IJ SOLN
INTRAMUSCULAR | Status: AC
  Filled 2020-05-20: qty 1

## 2020-05-20 MED ORDER — LACTATED RINGERS IV SOLN: INTRAVENOUS | Status: DC

## 2020-05-20 MED ORDER — KETOROLAC TROMETHAMINE 30 MG/ML IJ SOLN: 30.0000 mg | Freq: Four times a day (QID) | INTRAMUSCULAR | Status: AC | PRN

## 2020-05-20 MED ORDER — FENTANYL CITRATE (PF) 100 MCG/2ML IJ SOLN: 25.0000 ug | INTRAMUSCULAR | Status: DC | PRN

## 2020-05-20 MED ORDER — PHENYLEPHRINE HCL-NACL 20-0.9 MG/250ML-% IV SOLN
INTRAVENOUS | Status: DC | PRN
  Administered 2020-05-20: 60 ug/min via INTRAVENOUS

## 2020-05-20 MED ORDER — DIPHENHYDRAMINE HCL 50 MG/ML IJ SOLN: 12.5000 mg | Freq: Four times a day (QID) | INTRAMUSCULAR | Status: DC | PRN

## 2020-05-20 MED ORDER — COCONUT OIL OIL: 1.0000 "application " | TOPICAL_OIL | Status: DC | PRN

## 2020-05-20 MED ORDER — SODIUM CHLORIDE 0.9% FLUSH: 3.0000 mL | INTRAVENOUS | Status: DC | PRN

## 2020-05-20 MED ORDER — MORPHINE SULFATE (PF) 0.5 MG/ML IJ SOLN
INTRAMUSCULAR | Status: AC
  Filled 2020-05-20: qty 10

## 2020-05-20 MED ORDER — DIBUCAINE (PERIANAL) 1 % EX OINT: 1.0000 "application " | TOPICAL_OINTMENT | CUTANEOUS | Status: DC | PRN

## 2020-05-20 MED ORDER — SODIUM CHLORIDE 0.9 % IV SOLN: INTRAVENOUS | Status: DC | PRN

## 2020-05-20 MED ORDER — SIMETHICONE 80 MG PO CHEW: 80.0000 mg | CHEWABLE_TABLET | ORAL | Status: DC | PRN

## 2020-05-20 MED ORDER — KETOROLAC TROMETHAMINE 30 MG/ML IJ SOLN
INTRAMUSCULAR | Status: AC
  Filled 2020-05-20: qty 1

## 2020-05-20 MED ORDER — MENTHOL 3 MG MT LOZG: 1.0000 | LOZENGE | OROMUCOSAL | Status: DC | PRN

## 2020-05-20 MED ORDER — ONDANSETRON HCL 4 MG/2ML IJ SOLN
INTRAMUSCULAR | Status: DC | PRN
  Administered 2020-05-20 (×2): 4 mg via INTRAVENOUS

## 2020-05-20 MED ORDER — DIPHENHYDRAMINE HCL 25 MG PO CAPS: 25.0000 mg | ORAL_CAPSULE | Freq: Four times a day (QID) | ORAL | Status: DC | PRN

## 2020-05-20 MED ORDER — POVIDONE-IODINE 10 % EX SWAB
2.0000 "application " | Freq: Once | CUTANEOUS | Status: AC
  Administered 2020-05-20: 2 via TOPICAL

## 2020-05-20 MED ORDER — CEFAZOLIN SODIUM-DEXTROSE 2-4 GM/100ML-% IV SOLN
2.0000 g | INTRAVENOUS | Status: AC
  Administered 2020-05-20: 2 g via INTRAVENOUS

## 2020-05-20 MED ORDER — PHENYLEPHRINE HCL-NACL 20-0.9 MG/250ML-% IV SOLN
INTRAVENOUS | Status: AC
  Filled 2020-05-20: qty 250

## 2020-05-20 MED ORDER — SOD CITRATE-CITRIC ACID 500-334 MG/5ML PO SOLN
30.0000 mL | ORAL | Status: AC
  Administered 2020-05-20: 30 mL via ORAL

## 2020-05-20 MED ORDER — ONDANSETRON HCL 4 MG/2ML IJ SOLN
INTRAMUSCULAR | Status: AC
  Filled 2020-05-20: qty 4

## 2020-05-20 MED ORDER — IBUPROFEN 600 MG PO TABS
600.0000 mg | ORAL_TABLET | Freq: Four times a day (QID) | ORAL | Status: DC
  Administered 2020-05-21 – 2020-05-22 (×4): 600 mg via ORAL
  Filled 2020-05-20 (×5): qty 1

## 2020-05-20 MED ORDER — ONDANSETRON HCL 4 MG/2ML IJ SOLN: 4.0000 mg | Freq: Three times a day (TID) | INTRAMUSCULAR | Status: DC | PRN

## 2020-05-20 MED ORDER — NALOXONE HCL 4 MG/10ML IJ SOLN
1.0000 ug/kg/h | INTRAVENOUS | Status: DC | PRN
  Filled 2020-05-20: qty 5

## 2020-05-20 MED ORDER — ACETAMINOPHEN 160 MG/5ML PO SOLN: 1000.0000 mg | Freq: Once | ORAL | Status: DC

## 2020-05-20 MED ORDER — WITCH HAZEL-GLYCERIN EX PADS
1.0000 | MEDICATED_PAD | CUTANEOUS | Status: DC | PRN
Start: 2020-05-20 — End: 2020-05-23

## 2020-05-20 MED ORDER — NALOXONE HCL 0.4 MG/ML IJ SOLN: 0.4000 mg | INTRAMUSCULAR | Status: DC | PRN

## 2020-05-20 MED ORDER — SODIUM CHLORIDE 0.9 % IR SOLN
Status: DC | PRN
  Administered 2020-05-20: 1

## 2020-05-20 MED ORDER — KETOROLAC TROMETHAMINE 30 MG/ML IJ SOLN
30.0000 mg | Freq: Four times a day (QID) | INTRAMUSCULAR | Status: AC
  Administered 2020-05-20 – 2020-05-21 (×3): 30 mg via INTRAVENOUS
  Filled 2020-05-20 (×4): qty 1

## 2020-05-20 MED ORDER — SENNOSIDES-DOCUSATE SODIUM 8.6-50 MG PO TABS
2.0000 | ORAL_TABLET | ORAL | Status: DC
  Administered 2020-05-20 – 2020-05-22 (×3): 2 via ORAL
  Filled 2020-05-20 (×3): qty 2

## 2020-05-20 MED ORDER — OXYTOCIN-SODIUM CHLORIDE 30-0.9 UT/500ML-% IV SOLN
INTRAVENOUS | Status: DC | PRN
  Administered 2020-05-20: 30 [IU] via INTRAVENOUS

## 2020-05-20 MED ORDER — ACETAMINOPHEN 500 MG PO TABS: 1000.0000 mg | ORAL_TABLET | Freq: Once | ORAL | Status: DC

## 2020-05-20 MED ORDER — ENOXAPARIN SODIUM 40 MG/0.4ML IJ SOSY
40.0000 mg | PREFILLED_SYRINGE | INTRAMUSCULAR | Status: DC
  Administered 2020-05-21 – 2020-05-22 (×2): 40 mg via SUBCUTANEOUS
  Filled 2020-05-20 (×2): qty 0.4

## 2020-05-20 MED ORDER — FENTANYL CITRATE (PF) 100 MCG/2ML IJ SOLN
INTRAMUSCULAR | Status: AC
  Filled 2020-05-20: qty 2

## 2020-05-20 MED ORDER — KETOROLAC TROMETHAMINE 30 MG/ML IJ SOLN
INTRAMUSCULAR | Status: DC | PRN
  Administered 2020-05-20: 30 mg via INTRAVENOUS

## 2020-05-20 MED ORDER — CEFAZOLIN SODIUM-DEXTROSE 2-4 GM/100ML-% IV SOLN
INTRAVENOUS | Status: AC
  Filled 2020-05-20: qty 100

## 2020-05-20 MED ORDER — KETOROLAC TROMETHAMINE 30 MG/ML IJ SOLN: 30.0000 mg | Freq: Once | INTRAMUSCULAR | Status: DC

## 2020-05-20 MED ORDER — ACETAMINOPHEN 500 MG PO TABS
1000.0000 mg | ORAL_TABLET | Freq: Three times a day (TID) | ORAL | Status: DC
  Administered 2020-05-21 – 2020-05-22 (×4): 1000 mg via ORAL
  Filled 2020-05-20 (×7): qty 2

## 2020-05-20 MED ORDER — SOD CITRATE-CITRIC ACID 500-334 MG/5ML PO SOLN
ORAL | Status: AC
  Filled 2020-05-20: qty 30

## 2020-05-20 MED ORDER — MORPHINE SULFATE (PF) 0.5 MG/ML IJ SOLN
INTRAMUSCULAR | Status: DC | PRN
  Administered 2020-05-20: .15 mg via INTRATHECAL

## 2020-05-20 MED ORDER — PROMETHAZINE HCL 25 MG/ML IJ SOLN: 6.2500 mg | INTRAMUSCULAR | Status: DC | PRN

## 2020-05-20 MED ORDER — OXYTOCIN-SODIUM CHLORIDE 30-0.9 UT/500ML-% IV SOLN: 2.5000 [IU]/h | INTRAVENOUS | Status: AC

## 2020-05-20 MED ORDER — TETANUS-DIPHTH-ACELL PERTUSSIS 5-2.5-18.5 LF-MCG/0.5 IM SUSY: 0.5000 mL | PREFILLED_SYRINGE | Freq: Once | INTRAMUSCULAR | Status: DC

## 2020-05-20 MED ORDER — ACETAMINOPHEN 500 MG PO TABS
1000.0000 mg | ORAL_TABLET | Freq: Four times a day (QID) | ORAL | Status: DC
  Administered 2020-05-20: 1000 mg via ORAL

## 2020-05-20 MED ORDER — OXYCODONE HCL 5 MG PO TABS: 5.0000 mg | ORAL_TABLET | ORAL | Status: DC | PRN

## 2020-05-20 MED ORDER — FENTANYL CITRATE (PF) 100 MCG/2ML IJ SOLN
INTRAMUSCULAR | Status: DC | PRN
  Administered 2020-05-20: 15 ug via INTRATHECAL

## 2020-05-20 SURGICAL SUPPLY — 39 items
BENZOIN TINCTURE PRP APPL 2/3 (GAUZE/BANDAGES/DRESSINGS) ×2 IMPLANT
CHLORAPREP W/TINT 26ML (MISCELLANEOUS) ×2 IMPLANT
CLAMP CORD UMBIL (MISCELLANEOUS) IMPLANT
CLOTH BEACON ORANGE TIMEOUT ST (SAFETY) ×2 IMPLANT
DRAPE C SECTION CLR SCREEN (DRAPES) IMPLANT
DRSG OPSITE POSTOP 4X10 (GAUZE/BANDAGES/DRESSINGS) ×2 IMPLANT
ELECT REM PT RETURN 9FT ADLT (ELECTROSURGICAL) ×2
ELECTRODE REM PT RTRN 9FT ADLT (ELECTROSURGICAL) ×1 IMPLANT
EXTRACTOR VACUUM M CUP 4 TUBE (SUCTIONS) IMPLANT
GLOVE BIO SURGEON STRL SZ7.5 (GLOVE) ×2 IMPLANT
GLOVE BIOGEL PI IND STRL 7.0 (GLOVE) ×1 IMPLANT
GLOVE BIOGEL PI INDICATOR 7.0 (GLOVE) ×1
GOWN STRL REUS W/TWL 2XL LVL3 (GOWN DISPOSABLE) ×2 IMPLANT
GOWN STRL REUS W/TWL LRG LVL3 (GOWN DISPOSABLE) ×4 IMPLANT
KIT ABG SYR 3ML LUER SLIP (SYRINGE) IMPLANT
NEEDLE HYPO 22GX1.5 SAFETY (NEEDLE) ×2 IMPLANT
NEEDLE HYPO 25X5/8 SAFETYGLIDE (NEEDLE) IMPLANT
NS IRRIG 1000ML POUR BTL (IV SOLUTION) ×2 IMPLANT
PACK C SECTION WH (CUSTOM PROCEDURE TRAY) ×2 IMPLANT
PAD OB MATERNITY 4.3X12.25 (PERSONAL CARE ITEMS) ×2 IMPLANT
PENCIL SMOKE EVAC W/HOLSTER (ELECTROSURGICAL) ×2 IMPLANT
RTRCTR C-SECT PINK 25CM LRG (MISCELLANEOUS) ×2 IMPLANT
STRIP CLOSURE SKIN 1/2X4 (GAUZE/BANDAGES/DRESSINGS) ×2 IMPLANT
STRIP CLOSURE SKIN 1/4X4 (GAUZE/BANDAGES/DRESSINGS) ×2 IMPLANT
SUT CHROMIC 1 CTX 36 (SUTURE) ×4 IMPLANT
SUT PLAIN 0 NONE (SUTURE) IMPLANT
SUT VIC AB 1 CT1 36 (SUTURE) ×2 IMPLANT
SUT VIC AB 2-0 CT1 (SUTURE) ×2 IMPLANT
SUT VIC AB 2-0 CT1 27 (SUTURE) ×1
SUT VIC AB 2-0 CT1 TAPERPNT 27 (SUTURE) ×1 IMPLANT
SUT VIC AB 3-0 CT1 27 (SUTURE) ×1
SUT VIC AB 3-0 CT1 TAPERPNT 27 (SUTURE) ×1 IMPLANT
SUT VIC AB 3-0 SH 27 (SUTURE)
SUT VIC AB 3-0 SH 27X BRD (SUTURE) IMPLANT
SUT VIC AB 4-0 KS 27 (SUTURE) ×2 IMPLANT
SYR BULB IRRIGATION 50ML (SYRINGE) IMPLANT
TOWEL OR 17X24 6PK STRL BLUE (TOWEL DISPOSABLE) ×2 IMPLANT
TRAY FOLEY W/BAG SLVR 14FR LF (SET/KITS/TRAYS/PACK) ×2 IMPLANT
WATER STERILE IRR 1000ML POUR (IV SOLUTION) ×2 IMPLANT

## 2020-05-20 NOTE — Discharge Summary (Signed)
Postpartum Discharge Summary  Date of Service updated-yes     Patient Name: Sandra Thomas DOB: 15-May-1990 MRN: [id number]  Date of admission: 05/20/2020 Delivery date:05/20/2020  Delivering provider: Janet Berlin  Date of discharge: 05/22/2020  Admitting diagnosis: Malpresentation of fetus [O32.9XX0] Cesarean delivery delivered [O82] Intrauterine pregnancy: [redacted]w[redacted]d    Secondary diagnosis:  Active Problems:   Malpresentation of fetus   Cesarean delivery delivered  Additional problems: uterine synechiae, language barrier, late prenatal care    Discharge diagnosis: Term Pregnancy Delivered                                              Post partum procedures:none Augmentation: N/A Complications: None  Hospital course: Sceduled C/S   30y.o. yo G1P0000 at 418w0das admitted to the hospital 05/20/2020 for scheduled cesarean section with the following indication:Malpresentation.Delivery details are as follows:  Membrane Rupture Time/Date: 10:20 AM ,05/20/2020   Delivery Method:C-Section, Low Transverse  Details of operation can be found in separate operative note.  Patient had an uncomplicated postpartum course.  She is ambulating, tolerating a regular diet, passing flatus, and urinating well. She c/o dysuria and hematuria on POD #2, UA negative. Patient is discharged home in stable condition on  05/22/20        Newborn Data: Birth date:05/20/2020  Birth time:10:21 AM  Gender:Female  Living status:Living  Apgars:8 ,9  Weight:3266 g     Magnesium Sulfate received: No BMZ received: No Rhophylac:N/A MMR:N/A T-DaP:Given prenatally Flu: No Transfusion:No  Physical exam  Vitals:   05/21/20 2306 05/22/20 0151 05/22/20 0634 05/22/20 1440  BP: 105/64 (!) 95/58 104/83 115/73  Pulse: 98 98 96 (!) 105  Resp:   15 17  Temp: 98.4 F (36.9 C) 98.1 F (36.7 C) 98.2 F (36.8 C) 98 F (36.7 C)  TempSrc: Axillary Oral Oral Oral  SpO2: 99% 100%    Weight:      Height:        General: alert, cooperative and no distress Lochia: appropriate Uterine Fundus: firm Incision: No significant erythema, Dressing is clean, dry, and intact, old bloody drainage marked  DVT Evaluation: No evidence of DVT seen on physical exam. Negative Homan's sign. No cords or calf tenderness. Labs: Lab Results  Component Value Date   WBC 13.4 (H) 05/21/2020   HGB 8.8 (L) 05/21/2020   HCT 27.1 (L) 05/21/2020   MCV 77.0 (L) 05/21/2020   PLT 198 05/21/2020   CMP Latest Ref Rng & Units 05/20/2020  Creatinine 0.44 - 1.00 mg/dL 0.45   Edinburgh Score: Edinburgh Postnatal Depression Scale Screening Tool 05/20/2020  I have been able to laugh and see the funny side of things. (No Data)     After visit meds:  Allergies as of 05/22/2020      Reactions   Prenatal Vitamins    Hives, rash      Medication List    TAKE these medications   CALCIUM PO Take 600 mg by mouth daily.   ferrous sulfate 325 (65 FE) MG tablet Take 1 tablet (325 mg total) by mouth every other day. Start taking on: May 23, 2020   ibuprofen 600 MG tablet Commonly known as: ADVIL Take 1 tablet (600 mg total) by mouth every 6 (six) hours.   multivitamin-prenatal 27-0.8 MG Tabs tablet Take 1 tablet by mouth 2 (two)  times daily. Baby and Me   OMEGA 3 PO Take 2,000 Units by mouth 2 (two) times daily.   oxyCODONE 5 MG immediate release tablet Commonly known as: Oxy IR/ROXICODONE Take 1 tablet (5 mg total) by mouth every 6 (six) hours as needed for up to 7 days for moderate pain, severe pain or breakthrough pain.   Vitamin D-3 125 MCG (5000 UT) Tabs Take 5,000 Units by mouth daily.      Discharge home in stable condition Infant Feeding: Breast Infant Disposition:home with mother Discharge instruction: per After Visit Summary and Postpartum booklet. Activity: Advance as tolerated. Pelvic rest for 6 weeks.  Diet: routine diet Future Appointments: incision check 1 week Follow up Visit:  Please schedule  this patient for a In person postpartum visit in 4 weeks with the following provider: Any provider. Additional Postpartum F/U:Incision check 1 week  Low risk pregnancy complicated by: none Delivery mode:  C-Section, Low Transverse  Anticipated Birth Control:  Unsure   05/22/2020 Julianne Handler, CNM

## 2020-05-20 NOTE — Anesthesia Procedure Notes (Signed)
Spinal  Patient location during procedure: OR Start time: 05/20/2020 9:55 AM End time: 05/20/2020 9:58 AM Reason for block: surgical anesthesia Staffing Performed: anesthesiologist  Anesthesiologist: Kaylyn Layer, MD Preanesthetic Checklist Completed: patient identified, IV checked, risks and benefits discussed, monitors and equipment checked, pre-op evaluation and timeout performed Spinal Block Patient position: sitting Prep: DuraPrep and site prepped and draped Patient monitoring: heart rate, continuous pulse ox and blood pressure Approach: midline Location: L3-4 Injection technique: single-shot Needle Needle type: Pencan  Needle gauge: 24 G Needle length: 10 cm Assessment Sensory level: T4 Events: CSF return Additional Notes Risks, benefits, and alternative discussed. Patient gave consent to procedure. Prepped and draped in sitting position. Clear CSF obtained after one needle pass. Positive terminal aspiration. No pain or paraesthesias with injection. Patient tolerated procedure well. Vital signs stable. Amalia Greenhouse, MD

## 2020-05-20 NOTE — Lactation Note (Signed)
This note was copied from a baby's chart. Lactation Consultation Note  Patient Name: Boy Hopelynn Gartland [id number] Date: 05/20/2020   Age:29 hours  Attempted to visit with mom but she's not in her MBU room yet; she's still in the PACU. LC order put in at 1057. Will attempt to visit at a later time, spoke to 5th floor front desk, family has their own [facility] interpreter for Arabic.  Maternal Data    Feeding    LATCH Score                    Lactation Tools Discussed/Used    Interventions    Discharge    Consult Status      Joanny Dupree Venetia Constable 05/20/2020, 12:21 PM

## 2020-05-20 NOTE — Lactation Note (Addendum)
This note was copied from a baby's chart. Lactation Consultation Note  Patient Name: Sandra Thomas [id number] Date: 05/20/2020 Reason for consult: Initial assessment;Term;Primapara;1st time breastfeeding Age:29 hours  Consultation was completed with in-person [place] translator. P1 mom had a c-section delivery. Baby is [redacted]w[redacted]d GA and 3 hours of age at time of visit; he had one stool and one void while LC and student were in room. Baby was showing signs of hunger so LC student attempted to latch in laid back position to the left breast. LC came into room and then attempted to latch baby in x-cradle to the right breast. Baby was sleepy and reluctant so [facility] student taught mom how to hand express and spoon feed. [facility] student and mom easily expressed colostrum with great volume. Mom was giving large amounts of colostrum at once, so [facility] student educated mom on only giving drops at a time to prevent choking. [facility] student reviewed what to expect in the first day of life; size of baby's stomach, feeding baby at least 8-12 times a day, baby sleeping a lot, and cluster feeding. Mom is aware of lactation services and will call if needed before follow up visit.  Mom will need to be reinforced and encouraged on placing baby deeply onto breast, positioning, how often to feed baby, and spoon feeding.  Feeding plan: 1. Skin to skin 2. Feed baby on demand 8-12 times a day in a 24 hour period 3. Hand express and spoon feed as needed 4. Rest, stay hydrated, eat balanced meals   Maternal Data Has patient been taught Hand Expression?: Yes Does the patient have breastfeeding experience prior to this delivery?: No  Feeding Mother's Current Feeding Choice: Breast Milk  LATCH Score Latch: Too sleepy or reluctant, no latch achieved, no sucking elicited.  Audible Swallowing: None  Type of Nipple: Everted at rest and after stimulation  Comfort (Breast/Nipple): Soft / non-tender  Hold (Positioning): Full assist,  staff holds infant at breast  LATCH Score: 4   Lactation Tools Discussed/Used    Interventions Interventions: Breast feeding basics reviewed;Assisted with latch;Skin to skin;Breast massage;Hand express;Adjust position;Support pillows;Position options;Expressed milk;Education  Discharge    Consult Status Consult Status: Follow-up Date: 05/21/20    Cay Schillings 05/20/2020, 3:31 PM

## 2020-05-20 NOTE — Transfer of Care (Signed)
Immediate Anesthesia Transfer of Care Note  Patient: Sandra Thomas  Procedure(s) Performed: CESAREAN SECTION (N/A )  Patient Location: PACU  Anesthesia Type:Spinal  Level of Consciousness: awake, alert  and oriented  Airway & Oxygen Therapy: Patient Spontanous Breathing  Post-op Assessment: Report given to RN and Post -op Vital signs reviewed and stable  Post vital signs: Reviewed and stable  Last Vitals:  Vitals Value Taken Time  BP 116/66 05/20/20 1105  Temp    Pulse 86 05/20/20 1105  Resp 24 05/20/20 1105  SpO2 100 % 05/20/20 1105  Vitals shown include unvalidated device data.  Last Pain:  Vitals:   05/20/20 0828  TempSrc: Oral         Complications: No complications documented.

## 2020-05-20 NOTE — Op Note (Signed)
Operative Note   SURGERY DATE: 05/20/2020  PRE-OP DIAGNOSIS:  *Pregnancy at [redacted]w[redacted]d  *Breech presentation  POST-OP DIAGNOSIS: same   PROCEDURE: primary low transverse cesarean section via pfannenstiel skin incision with double layer uterine closure  SURGEON: Surgeon(s) and Role:    * Hermina Staggers, MD - Primary    * Myriam Jacobson Arlana Pouch, MD - Assisting  ANESTHESIA: epidural  ESTIMATED BLOOD LOSS: 376  DRAINS: UOP via indwelling foley  TOTAL IV FLUIDS: crystalloid  VTE PROPHYLAXIS: SCDs to bilateral lower extremities  ANTIBIOTICS: Two grams of Cefazolin were given., within 1 hour of skin incision  SPECIMENS: placenta to L&D   COMPLICATIONS: none  INDICATIONS: Fetal malpresentation   FINDINGS: No intra-abdominal adhesions were noted. Grossly normal uterus (no synechiae noted), tubes and ovaries. clear amniotic fluid, breech female infant, weight pending gm, APGARs 9/9, intact placenta.  PROCEDURE IN DETAIL: The patient was taken to the operating room where anesthesia was administered and normal fetal heart tones were confirmed. She was then prepped and draped in the normal fashion in the dorsal supine position with a leftward tilt.  After a time out was performed, a pfannensteil skin incision was made with the scalpel and carried through to the underlying layer of fascia. The fascia was then incised at the midline and this incision was extended laterally with the mayo scissors. Attention was turned to the superior aspect of the fascial incision which was grasped with the kocher clamps x 2, tented up and the rectus muscles were dissected off bluntly. In a similar fashion the inferior aspect of the fascial incision was grasped with the kocher clamps, tented up and the rectus muscles dissected off with the mayo scissors. The rectus muscles were then separated in the midline and the peritoneum was entered bluntly. The Alexis retractor was inserted.   A low transverse hysterotomy  was made with the scalpel until the endometrial cavity was breached and the amniotic sac ruptured with the Allis clamp, yielding clear amniotic fluid. This incision was extended bluntly and the infant's head, shoulders and body were delivered from breech using routine breech maneuvers atraumatically.The cord was clamped x 2 and cut, and the infant was handed to the awaiting pediatricians, after delayed cord clamping was done.  The placenta was then gradually expressed from the uterus and then the uterus was exteriorized and cleared of all clots and debris. The hysterotomy was repaired with a running suture of 0 chromic. A second imbricating layer of 0 chromic suture was then placed.    The uterus and adnexa were then returned to the abdomen, and the hysterotomy and all operative sites were reinspected and excellent hemostasis was noted after irrigation and suction of the abdomen with warm saline.  The peritoneum was closed with a running stitch of 3-0 Vicryl. The fascia was reapproximated with 0 Vicryl in a simple running fashion bilaterally. The subcutaneous layer was then reapproximated with interrupted sutures of 2-0 plain gut, and the skin was then closed with 4-0 vicryl, in a subcuticular fashion.  The patient  tolerated the procedure well. Sponge, lap, needle, and instrument counts were correct x 2. The patient was transferred to the recovery room awake, alert and breathing independently in stable condition.   Casper Harrison, MD [facility] Fellow, [facility] for [facility], [facility]

## 2020-05-20 NOTE — H&P (Addendum)
Obstetric Preoperative History and Physical  Sandra Thomas is a 30 y.o. G1P0000 with IUP at [redacted]w[redacted]d presenting for scheduled cesarean section.  Reports good fetal movement, no bleeding, no contractions, no leaking of fluid.  No acute preoperative concerns.    Cesarean Section Indication: malpresentation: breech  Prenatal Course Source of Care: [facility]    Pregnancy complications or risks: Patient Active Problem List   Diagnosis Date Noted  . Malpresentation of fetus 05/20/2020  . [redacted] weeks gestation of pregnancy 05/12/2020  . Breech presentation, antepartum 05/04/2020  . 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 Late prenatal care affecting pregnancy in second trimester 02/06/2020  . Supervision of low-risk first pregnancy 02/03/2020   She plans to breastfeed She desires no method for postpartum contraception.   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  History reviewed. No pertinent surgical history.  OB History  Gravida Para Term Preterm AB Living  1 0 0 0 0 0  SAB IAB Ectopic Multiple Live Births  0 0 0 0 0    # Outcome Date GA Lbr Len/2nd Weight Sex Delivery Anes PTL Lv  1 Current             Social History   Socioeconomic History  . Marital status: Married    Spouse name: Not on file  . Number of children: Not on file  . Years of  education: Not on file  . Highest education level: Not on file  Occupational History  . Not on file  Tobacco Use  . Smoking status: Never Smoker  . Smokeless tobacco: Never Used  Vaping Use  . Vaping Use: Never used  Substance and Sexual Activity  . Alcohol use: Never  . Drug use: Never  . Sexual activity: Yes  Other Topics Concern  . Not on file  Social History Narrative  . Not on file   Social Determinants of Health   Financial Resource Strain: Not on file  Food Insecurity: No Food Insecurity  . Worried About Programme researcher, broadcasting/film/video in the Last Year: Never true  . Ran Out of Food in the Last Year: Never true  Transportation Needs: No Transportation Needs  . Lack of Transportation (Medical): No  . Lack of Transportation (Non-Medical): No  Physical Activity: Not on file  Stress: Not on file  Social Connections: Not on file    Family History  Problem Relation Age of Onset  . Hypertension Mother     Medications Prior to Admission  Medication Sig Dispense Refill Last Dose  . CALCIUM PO Take 600 mg by mouth daily.     . Cholecalciferol (VITAMIN D-3) 125 MCG (5000 UT) TABS Take 5,000 Units by mouth daily.     . Omega-3 Fatty Acids (OMEGA 3 PO) Take 2,000 Units by mouth 2 (two) times daily.     . Prenatal Vit-Fe Fumarate-FA (MULTIVITAMIN-PRENATAL) 27-0.8 MG TABS tablet Take 1 tablet by mouth 2 (two) times  daily. Baby and Me       Allergies  Allergen Reactions  . Prenatal Vitamins     Hives, rash    Review of Systems: Pertinent items noted in HPI and remainder of comprehensive ROS otherwise negative.  Physical Exam: BP (!) 121/93 (BP Location: Right Arm)   Pulse 92   Temp 98.8 F (37.1 C) (Oral)   Resp 18   Ht 5' 4.57" (1.64 m)   Wt 67.7 kg   LMP 08/14/2019   SpO2 99%   BMI 25.16 kg/m  FHR by Doppler: 137 bpm CONSTITUTIONAL: Well-developed, well-nourished female in no acute distress.  HENT:  Normocephalic, atraumatic,   SKIN: Skin is warm and dry. No rash  noted. Not diaphoretic. No erythema. No pallor. NEUROLGIC: Alert and oriented to person, place, and time.   PSYCHIATRIC: Normal mood and affect.  CARDIOVASCULAR: Normal heart rate noted RESPIRATORY: Effort and breath sounds normal, no problems with respiration noted ABDOMEN: Soft, nontender, nondistended, gravid.  PELVIC: Deferred MUSCULOSKELETAL: Normal range of motion. No edema and no tenderness. 2+ distal pulses.   Pertinent Labs/Studies:   Results for orders placed or performed during the hospital encounter of 05/18/20 (from the past 72 hour(s))  SARS CORONAVIRUS 2 (TAT 6-24 HRS) Nasopharyngeal Nasopharyngeal Swab     Status: None   Collection Time: 05/18/20 11:56 AM   Specimen: Nasopharyngeal Swab  Result Value Ref Range   SARS Coronavirus 2 NEGATIVE NEGATIVE    Comment: (NOTE) SARS-CoV-2 target nucleic acids are NOT DETECTED.  The SARS-CoV-2 RNA is generally detectable in upper and lower respiratory specimens during the acute phase of infection. Negative results do not preclude SARS-CoV-2 infection, do not rule out co-infections with other pathogens, and should not be used as the sole basis for treatment or other patient management decisions. Negative results must be combined with clinical observations, patient history, and epidemiological information. The expected result is Negative.  Fact Sheet for Patients: HairSlick.no  Fact Sheet for Healthcare Providers: quierodirigir.com  This test is not yet approved or cleared by the [place] FDA and  has been authorized for detection and/or diagnosis of SARS-CoV-2 by FDA under an Emergency Use Authorization (EUA). This EUA will remain  in effect (meaning this test can be used) for the duration of the COVID-19 declaration under Se ction 564(b)(1) of the Act, 21 U.S.C. section 360bbb-3(b)(1), unless the authorization is terminated or revoked sooner.  Performed at [facility], [address] [place], [place] [address]     Assessment and Plan: Sandra Thomas is a 30 y.o. G1P0000 at [redacted]w[redacted]d being admitted for scheduled cesarean section. The risks of cesarean section discussed with the patient included but were not limited to: bleeding which may require transfusion or reoperation; infection which may require antibiotics; injury to bowel, bladder, ureters or other surrounding organs; injury to the fetus; need for additional procedures including hysterectomy in the event of a life-threatening hemorrhage; placental abnormalities with subsequent pregnancies, incisional problems, thromboembolic phenomenon and other postoperative/anesthesia complications. The patient concurred with the proposed plan, giving informed written consent for the procedure. Patient has been NPO since last night she will remain NPO for procedure. Anesthesia and OR aware. Preoperative prophylactic antibiotics and SCDs ordered on call to the OR. To OR when ready.   Pregnancy Complications:   -uterine synechiae  -fetal malpresentation  -polyhydramnios  -LGA   Contraception: declines Circumcision: yes   Casper Harrison, MD [facility] Family Medicine Fellow, [facility], [facility]   OB Attending. Bedside U/S confirmed breech presentation. Will proceed to c section. R/B/Post op care reviewed as noted above.  Nettie Elm, MD, [facility]

## 2020-05-20 NOTE — Anesthesia Postprocedure Evaluation (Signed)
Anesthesia Post Note  Patient: Sandra Thomas  Procedure(s) Performed: CESAREAN SECTION (N/A )     Patient location during evaluation: PACU Anesthesia Type: Spinal Level of consciousness: awake and alert and oriented Pain management: pain level controlled Vital Signs Assessment: post-procedure vital signs reviewed and stable Respiratory status: spontaneous breathing, nonlabored ventilation and respiratory function stable Cardiovascular status: blood pressure returned to baseline Postop Assessment: no apparent nausea or vomiting, spinal receding, no headache and no backache Anesthetic complications: no   No complications documented.  Last Vitals:  Vitals:   05/20/20 1145 05/20/20 1200  BP: (!) 126/93 118/82  Pulse: 84 82  Resp: (!) 22 15  Temp: 36.6 C   SpO2: 100% 100%    Last Pain:  Vitals:   05/20/20 1215  TempSrc:   PainSc: 2    Pain Goal:                Epidural/Spinal Function Cutaneous sensation: Tingles (05/20/20 1215), Patient able to flex knees: Yes (05/20/20 1215), Patient able to lift hips off bed: Yes (05/20/20 1215), Back pain beyond tenderness at insertion site: No (05/20/20 1215), Progressively worsening motor and/or sensory loss: No (05/20/20 1215), Bowel and/or bladder incontinence post epidural: No (05/20/20 1215)  Kaylyn Layer

## 2020-05-21 DIAGNOSIS — D649 Anemia, unspecified: Secondary | ICD-10-CM

## 2020-05-21 DIAGNOSIS — O9903 Anemia complicating the puerperium: Secondary | ICD-10-CM

## 2020-05-21 LAB — CBC
HCT: 27.1 % — ABNORMAL LOW (ref 36.0–46.0)
Hemoglobin: 8.8 g/dL — ABNORMAL LOW (ref 12.0–15.0)
MCH: 25 pg — ABNORMAL LOW (ref 26.0–34.0)
MCHC: 32.5 g/dL (ref 30.0–36.0)
MCV: 77 fL — ABNORMAL LOW (ref 80.0–100.0)
Platelets: 198 10*3/uL (ref 150–400)
RBC: 3.52 MIL/uL — ABNORMAL LOW (ref 3.87–5.11)
RDW: 14.6 % (ref 11.5–15.5)
WBC: 13.4 10*3/uL — ABNORMAL HIGH (ref 4.0–10.5)
nRBC: 0 % (ref 0.0–0.2)

## 2020-05-21 MED ORDER — FERROUS SULFATE 325 (65 FE) MG PO TABS
325.0000 mg | ORAL_TABLET | ORAL | Status: DC
  Administered 2020-05-21: 325 mg via ORAL
  Filled 2020-05-21: qty 1

## 2020-05-21 NOTE — Lactation Note (Signed)
This note was copied from a baby's chart. Lactation Consultation Note  Patient Name: Sandra Thomas OMVEH'M Date: 05/21/2020 Reason for consult: Follow-up assessment Age:29 hours   LC Follow Up Visit:  Per RN, mother sleeping.  She will call later when mother awakens for a follow up visit.   Maternal Data    Feeding    LATCH Score                    Lactation Tools Discussed/Used    Interventions    Discharge    Consult Status Consult Status: Follow-up Date: 05/21/20 Follow-up type: In-patient    Dora Sims 05/21/2020, 2:46 PM

## 2020-05-21 NOTE — Progress Notes (Signed)
Subjective: Postpartum Day 1: Cesarean Delivery Patient reports incisional pain, + flatus and no problems voiding.    Objective: Vital signs in last 24 hours: Temp:  [96.7 F (35.9 C)-98.8 F (37.1 C)] 98.4 F (36.9 C) (05/01 0616) Pulse Rate:  [69-105] 86 (05/01 0616) Resp:  [14-23] 14 (05/01 0616) BP: (98-132)/(58-93) 98/58 (05/01 0616) SpO2:  [97 %-100 %] 97 % (05/01 0616)  Physical Exam:  General: alert, cooperative and no distress Lochia: appropriate Uterine Fundus: firm Incision: bleeding on honey comb dressing marked on 4/30 and no additional bleeding today on 5/1 DVT Evaluation: No evidence of DVT seen on physical exam.  Recent Labs    05/20/20 1416 05/21/20 0441  HGB 10.2* 8.8*  HCT 31.6* 27.1*    Assessment/Plan: Status post Cesarean section. Doing well postoperatively.  Hgb dropped from 10.2 to 8.8, pt asymptomatic Add ferrous sulfate every other day to start now Continue current care.   Sharen Counter 05/21/2020, 10:31 AM

## 2020-05-21 NOTE — Lactation Note (Signed)
This note was copied from a baby's chart. Lactation Consultation Note  Patient Name: Boy Daphene Chisholm [id number] Date: 05/21/2020 Reason for consult: Follow-up assessment;Primapara;1st time breastfeeding;Term Age:29 hours  P1 mother whose infant is now 35 hours old.  This is a term baby at 40+0 weeks.  Baby was asleep STS on mother's chest when I arrived; he was bathed 1/2 hour ago.  Father is the interpreter for mother.  Father reported that baby is not latching and mother needs assistance.  She has been attempting to breast feed but has not been successful.  She is supplementing with Enfamil 20 calorie formula.  Offered to return after the hour of STS is completed and parents will have RN call me at that time.  Father stated that mother's nipples are flat.  She has a manual pump at bedside and reminded her to pre-pump prior to latching.  I will observe and provide further education when they call for assistance.  RN updated.    Maternal Data    Feeding Mother's Current Feeding Choice: Breast Milk  LATCH Score                    Lactation Tools Discussed/Used    Interventions    Discharge    Consult Status Consult Status: Follow-up Date: 05/21/20 Follow-up type: In-patient    Tirsa Gail R Jazzman Loughmiller 05/21/2020, 5:08 PM

## 2020-05-21 NOTE — Progress Notes (Signed)
Patient declined 0600 medications due to wanting rest per patient.

## 2020-05-21 NOTE — Lactation Note (Signed)
This note was copied from a baby's chart. Lactation Consultation Note  Patient Name: Sandra Thomas AVWUJ'W Date: 05/21/2020 Reason for consult: Follow-up assessment;Primapara;1st time breastfeeding;Term Age:29 hours   LC Follow Up Visit:  Parents requested latch assistance.  Baby fussy and ready to eat when I arrived.  Asked mother to demonstrate hand expression and I finger fed drops of colostrum back to baby.  Mother interested in doing the cradle hold, however, I suggested the cross cradle hold with an explanation as to why this is more beneficial and mother willing to try.  Attempted to latch in this position but baby fussy and pushed back from the breast.  Offered to do the football hold and he latched much better.  Demonstrated breast compressions and how to latch deeply.  Baby began gulping at the breast and fed actively for 5 minutes before pushing back off the breast.  Demonstrated effective burping and baby was very calm and alert.  Swaddled baby for parents and he remained calm.  Educated on feeding cues, latching and how to be know when baby is finished feeding.  Parents appreciative.  Suggested they call for assistance tonight with latching as needed.  RN updated.   Maternal Data    Feeding Mother's Current Feeding Choice: Breast Milk and Formula  LATCH Score Latch: Grasps breast easily, tongue down, lips flanged, rhythmical sucking.  Audible Swallowing: Spontaneous and intermittent  Type of Nipple: Everted at rest and after stimulation  Comfort (Breast/Nipple): Soft / non-tender  Hold (Positioning): Assistance needed to correctly position infant at breast and maintain latch.  LATCH Score: 9   Lactation Tools Discussed/Used    Interventions Interventions: Breast feeding basics reviewed;Assisted with latch;Skin to skin;Hand express;Breast compression;Adjust position;Hand pump;Position options;Support pillows;Education  Discharge    Consult Status Consult  Status: Follow-up Date: 05/22/20 Follow-up type: In-patient    Tzirel Leonor R Maire Govan 05/21/2020, 5:50 PM

## 2020-05-21 NOTE — Lactation Note (Signed)
This note was copied from a baby's chart. Lactation Consultation Note  Patient Name: Sandra Thomas Date: 05/21/2020 Reason for consult: 1st time breastfeeding;Term;Follow-up assessment;Mother's request;Difficult latch Age:29 hours P1, term female infant with signed Family  Interpreter. LC entered room, infant cuing to breastfeed, per mom, infant  has not been sustaining latch, he is on and off  the breast  most feedings are  5 minutes or less in length. Mom has breast pump to pre-pump breast prior to latching infant due to having flat nipples. LC observed infant has tendency raise tongue to roof of mouth and suck in bottom chin, LC did suck training with infant on her gloved finger and lower jaw excursions to get infant to lower jaw and drop tongue in downward position to latch at the breast. Infant latches better in a cross cradle hold, after multiple attempts infant started sustaining latch and breastfeed for 10 minutes. LC worked with mom on hand positioning of breast  and avoid stoking infant's head and face when infant is latched at the breast. Mom hand expressed 6 mls of colostrum that was given to infant by spoon after infant was latched at the breast. RN will give mom breast shells to wear in bra during the day and not at night to help evert  mom's nipple shaft out more. Parents understand that infant should breastfeed according to hunger cues, 8 to 12 or more times within 24 hours, STS. Mom knows to call RN or [facility] for assistance with latch if needed. LC praised mom on her breastfeeding efforts tonight.    Maternal Data    Feeding Mother's Current Feeding Choice: Breast Milk  LATCH Score Latch: Repeated attempts needed to sustain latch, nipple held in mouth throughout feeding, stimulation needed to elicit sucking reflex.  Audible Swallowing: A few with stimulation  Type of Nipple: Flat  Comfort (Breast/Nipple): Soft / non-tender  Hold (Positioning): Assistance  needed to correctly position infant at breast and maintain latch.  LATCH Score: 6   Lactation Tools Discussed/Used    Interventions Interventions: Assisted with latch;Skin to skin;Breast massage;Hand express;Adjust position;Support pillows;Breast compression;Position options;Expressed milk;Hand pump;Education  Discharge Pump: Personal WIC Program: No  Consult Status Consult Status: Follow-up Date: 05/21/20 Follow-up type: In-patient    Danelle Earthly 05/21/2020, 2:42 AM

## 2020-05-22 DIAGNOSIS — O0933 Supervision of pregnancy with insufficient antenatal care, third trimester: Secondary | ICD-10-CM

## 2020-05-22 DIAGNOSIS — Z3A40 40 weeks gestation of pregnancy: Secondary | ICD-10-CM

## 2020-05-22 DIAGNOSIS — O329XX0 Maternal care for malpresentation of fetus, unspecified, not applicable or unspecified: Secondary | ICD-10-CM

## 2020-05-22 LAB — URINALYSIS, ROUTINE W REFLEX MICROSCOPIC
Bilirubin Urine: NEGATIVE
Glucose, UA: NEGATIVE mg/dL
Hgb urine dipstick: NEGATIVE
Ketones, ur: NEGATIVE mg/dL
Leukocytes,Ua: NEGATIVE
Nitrite: NEGATIVE
Protein, ur: NEGATIVE mg/dL
Specific Gravity, Urine: 1.01 (ref 1.005–1.030)
pH: 7 (ref 5.0–8.0)

## 2020-05-22 MED ORDER — OXYCODONE HCL 5 MG PO TABS: 5.0000 mg | ORAL_TABLET | Freq: Four times a day (QID) | ORAL | 0 refills | Status: AC | PRN

## 2020-05-22 MED ORDER — IBUPROFEN 600 MG PO TABS: 600.0000 mg | ORAL_TABLET | Freq: Four times a day (QID) | ORAL | 0 refills | Status: DC

## 2020-05-22 MED ORDER — FERROUS SULFATE 325 (65 FE) MG PO TABS: 325.0000 mg | ORAL_TABLET | ORAL | 1 refills | Status: DC

## 2020-05-22 NOTE — Lactation Note (Addendum)
This note was copied from a baby's chart. Lactation Consultation Note  Patient Name: Sandra Thomas NFAOZ'H Date: 05/22/2020 Reason for consult: Follow-up assessment Age:29 hours   Arabic Mother , Father reports that he will Interpret all LC teaching.  Mother report that, " infant is not getting the nipple". Discussed infants weight loss and infants need to get the entire nipple and beyond to get enough milk, listen for swallows and good jaw movement. Father reports that mother doesn't want to give any formula for supplement .  Mother began hand expressing in a spoon.  She expressed  approx 8 ml and then another 6 ml.   Assist mother with latching infant on the Rt breast.  Infant on and off for several mins.  Rt nipple short and bifurcated. Mother was fit with a #24 NS . Instruct in the use of the Nipple shield and application.  Infant sustained latch for 10 mins with obs milk in the shield.  Mother has DEBP at home. She was instructed to hand exp before and after each feeding.  Cue base feed Discussed cluster feeding.  Lots of teaching with parents. Mother to pump for 15 mins on each breast with hand pump.  Discussed treatment and prevention of engorgement.   Plan of Care :  Breastfeed infant with feeding cues Supplement infant with ebm/DBM according to supplemental guidelines. Pump using a DEBP after each feeding for 15-20 mins.   Mother to continue to cue base feed infant and feed at least 8-12 times or more in 24 hours and advised to allow for cluster feeding infant as needed.  Mother to continue to due STS. Mother is aware of available LC services at [facility], BFSG'S, OP Dept, and phone # for questions or concerns about breastfeeding.  Mother receptive to all teaching and plan of care.     Maternal Data    Feeding Mother's Current Feeding Choice: Breast Milk  LATCH Score Latch: Grasps breast easily, tongue down, lips flanged, rhythmical sucking.  Audible Swallowing:  A few with stimulation (obs milk in the shield)  Type of Nipple: Everted at rest and after stimulation (Rt breast short, nipple biforcated)  Comfort (Breast/Nipple): Filling, red/small blisters or bruises, mild/mod discomfort  Hold (Positioning): Assistance needed to correctly position infant at breast and maintain latch.  LATCH Score: 7   Lactation Tools Discussed/Used Tools: Nipple Shields Nipple shield size: 24  Interventions Interventions: Assisted with latch;Skin to skin;Hand express;Breast compression;Adjust position;Support pillows;Position options;Hand pump  Discharge Pump: Manual  Consult Status Consult Status: Follow-up Date: 05/22/20 Follow-up type: In-patient    Stevan Born [facility] 05/22/2020, 11:16 AM

## 2021-02-20 IMAGING — US US MFM OB DETAIL+14 WK
1 series · 13 of 28 positions shown · non-contrast
Comparison: none

[Series 1: us mfm ob detail+14 wk · 166 acquisitions, 13 frames shown]
[im 7/166]
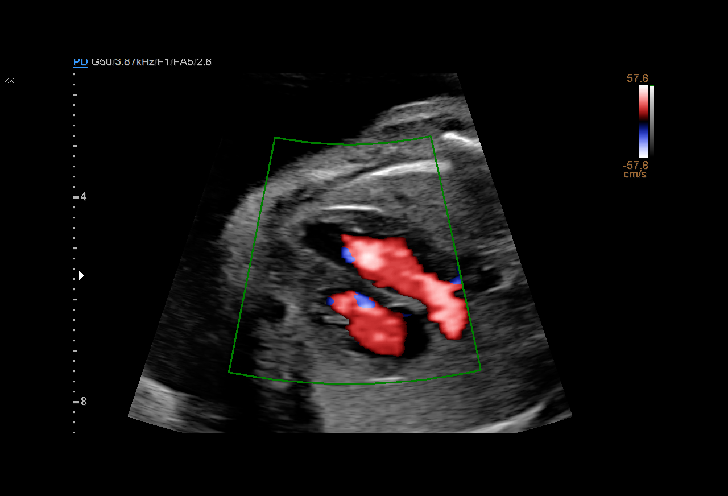
[im 19/166]
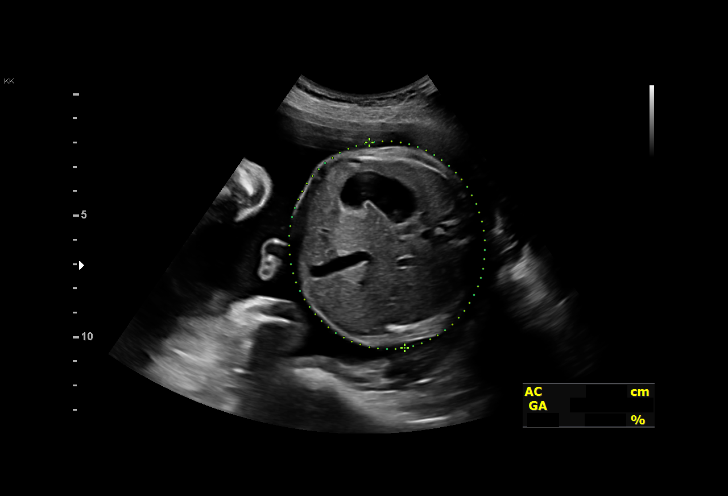
[im 31/166]
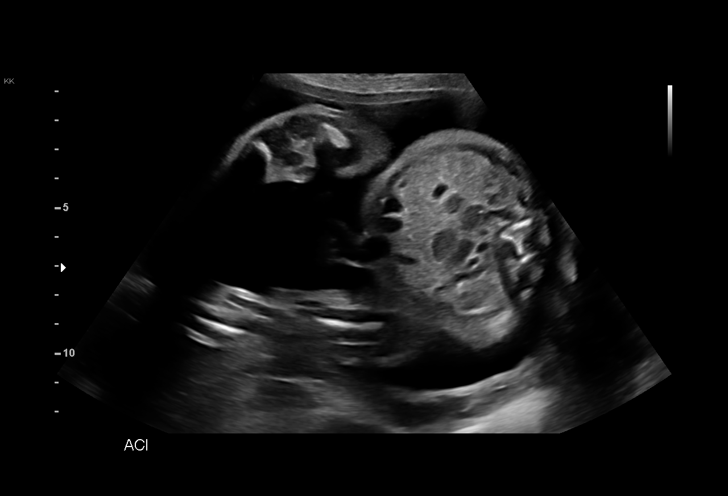
[im 43/166]
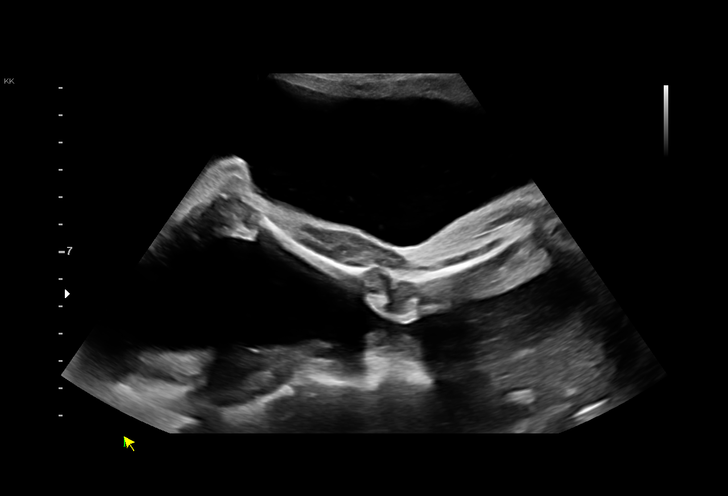
[im 56/166]
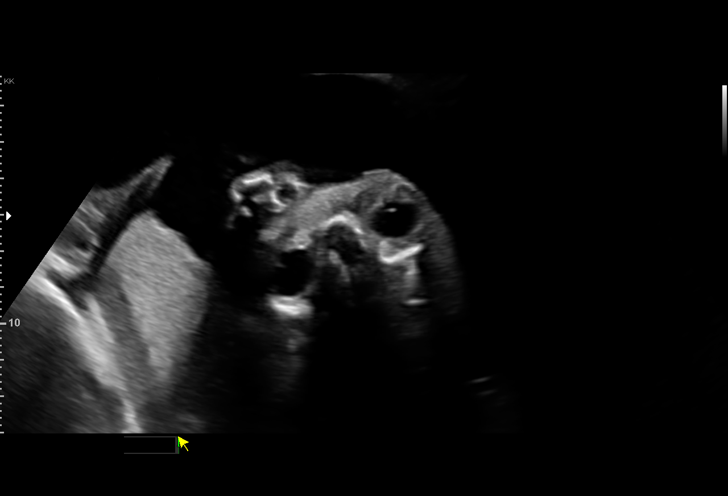
[im 68/166]
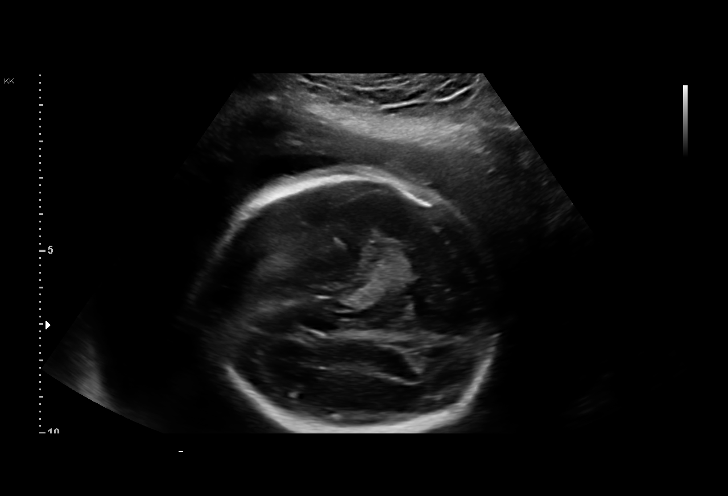
[im 86/166]
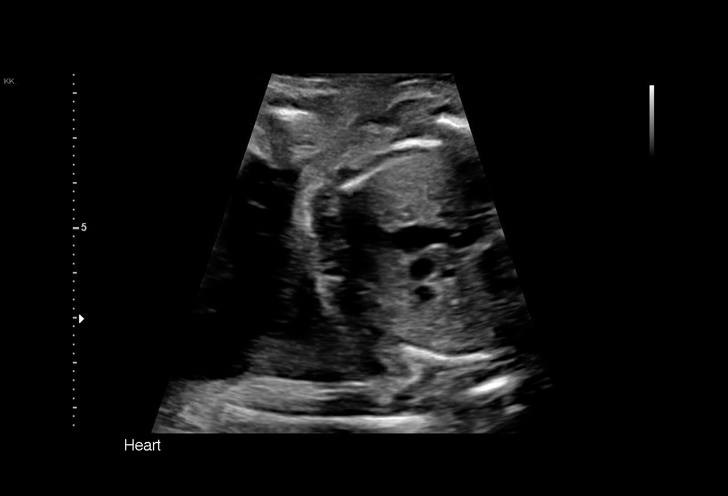
[im 98/166]
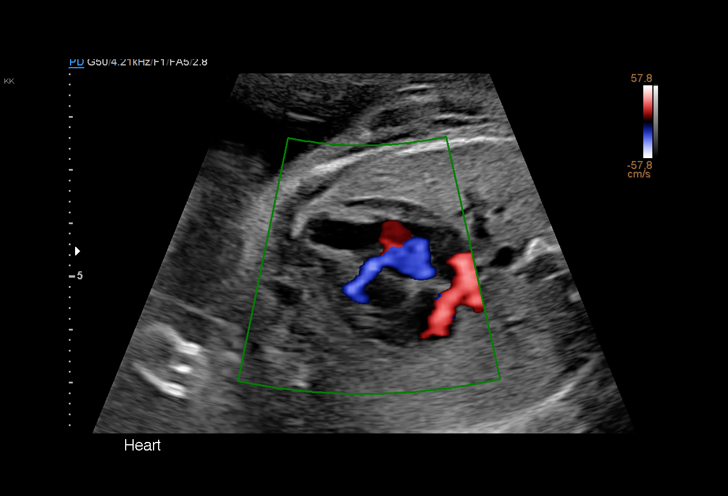
[im 111/166]
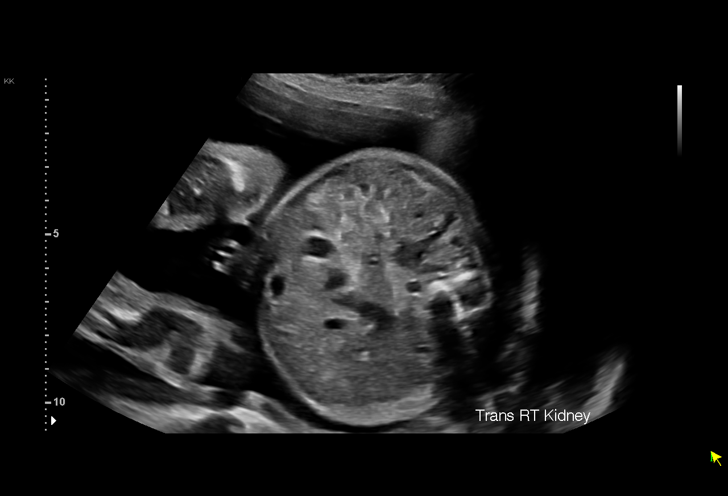
[im 123/166]
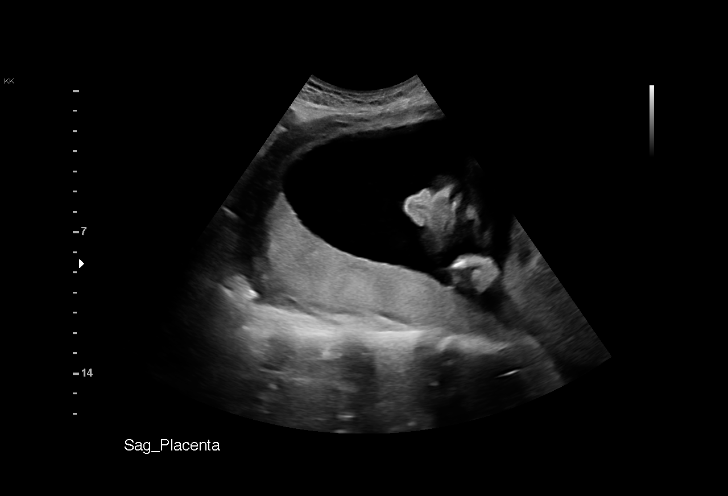
[im 135/166]
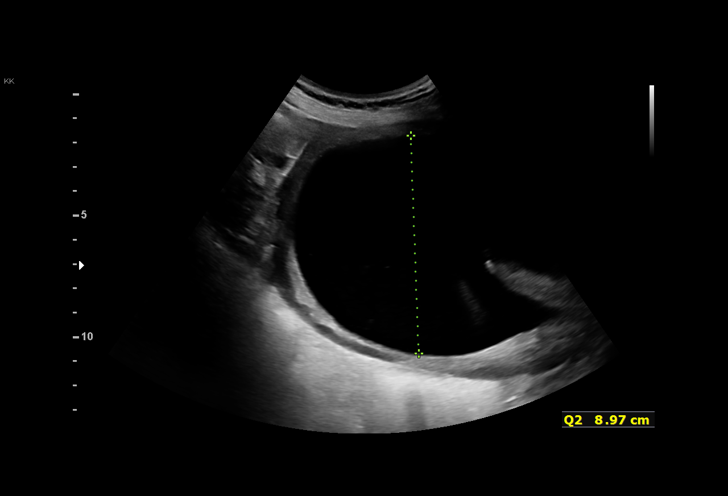
[im 147/166]
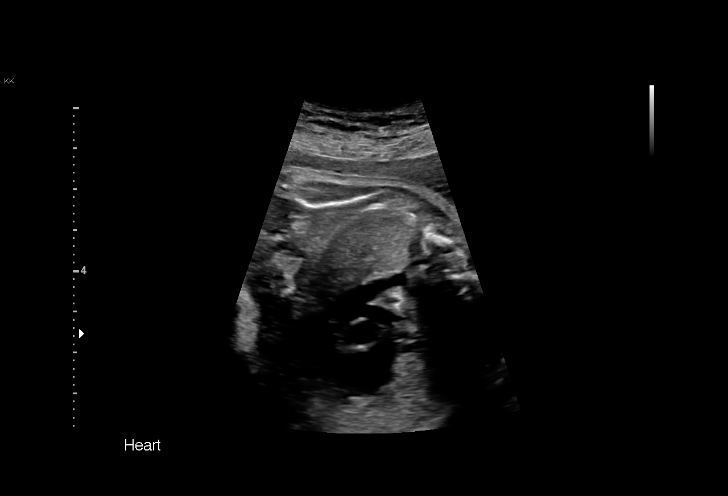
[im 159/166]
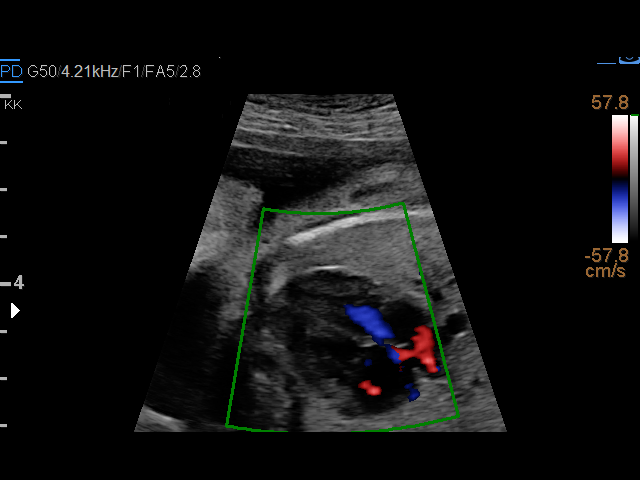

[13 of 28 positions shown; findings below may reference images not displayed]

[REDACTED]
                                                            Healthcare
                   WALKER CNM

Indications

 26 weeks gestation of pregnancy
 Encounter for antenatal screening for
 malformations
 Late to prenatal care, third trimester
 Uterine abnormality during pregnancy
 (Synechiae)
Fetal Evaluation

 Num Of Fetuses:         1
 Fetal Heart Rate(bpm):  157
 Cardiac Activity:       Observed
 Presentation:           Breech
 Placenta:               Posterior
 P. Cord Insertion:      Visualized

 Amniotic Fluid
 AFI FV:      Within normal limits

 AFI Sum(cm)     %Tile       Largest Pocket(cm)
 23.13           96

 RUQ(cm)       RLQ(cm)       LUQ(cm)        LLQ(cm)

Biometry
 BPD:      73.7  mm     G. Age:  29w 4d         99  %    CI:        79.58   %    70 - 86
                                                         FL/HC:      19.4   %    18.6 -
 HC:      261.1  mm     G. Age:  28w 3d         78  %    HC/AC:      1.00        1.05 -
 AC:      260.5  mm     G. Age:  30w 1d       > 99  %    FL/BPD:     68.7   %    71 - 87
 FL:       50.6  mm     G. Age:  27w 1d         49  %    FL/AC:      19.4   %    20 - 24
 HUM:      46.7  mm     G. Age:  27w 4d         64  %

 Est. FW:    1418  gm    2 lb 14 oz    > 99  %
OB History

 Gravidity:    1
Gestational Age

 LMP:           26w 5d        Date:  08/14/19                 EDD:   05/20/20
 U/S Today:     28w 6d                                        EDD:   05/05/20
 Best:          26w 5d     Det. By:  LMP  (08/14/19)          EDD:   05/20/20
Anatomy

 Cranium:               Appears normal         Aortic Arch:            Appears normal
 Cavum:                 Appears normal         Ductal Arch:            Appears normal
 Ventricles:            Appears normal         Diaphragm:              Appears normal
 Choroid Plexus:        Appears normal         Stomach:                Appears normal, left
                                                                       sided
 Cerebellum:            Appears normal         Abdomen:                Appears normal
 Posterior Fossa:       Appears normal         Abdominal Wall:         Appears nml (cord
                                                                       insert, abd wall)
 Nuchal Fold:           Not applicable (>20    Cord Vessels:           Appears normal (3
                        wks GA)                                        vessel cord)
 Face:                  Appears normal         Kidneys:                Appear normal
                        (orbits and profile)
 Lips:                  Appears normal         Bladder:                Appears normal
 Thoracic:              Appears normal         Spine:                  Not well visualized
 Heart:                 Appears normal         Upper Extremities:      Appears normal
                        (4CH, axis, and
                        situs)
 RVOT:                  Appears normal         Lower Extremities:      Appears normal
 LVOT:                  Appears normal

 Other:  Fetus a male. Heels visualized. Technically difficult due to advanced
         GA and fetal position.
Cervix Uterus Adnexa

 Uterus
 Intrauterine synechiae seen.
Impression

 Single intrauterine pregnancy here for a detailed anatomy
 due to late transfer of care with suspected ventricular septal
 defect and uterine septum.
 Normal anatomy with measurements consistent with dates
 There is good fetal movement and amniotic fluid volume
 Suboptimal views of the fetal anatomy were obtained
 secondary to fetal position.

 I reviewed today's study and recommend serial growth, fetal
 echocardiogram. Ms. SIGFREDO denied a referral to the
 pediatric cardiologist. I discussed that we did not observe a
 ventricular septal defect. We interrograted the heart in the 4
 chamber apical view as well as the subcostal view. We did
 not observe any markers of aneuploidy and discussed the
 option of NIPS testing. She declined.

 In addition, we observed a uterine synechiae that was without
 color flow and the fetus freely moved around this area.

 Lastly, we recommended follow up in 4 weeks to assess fetal
 growth , the heart and trend dates as she is measuring two
 weeks ahead. She conveyed that she will likely not return for
 financial reasons. I provided an appointment slip
 nevertheless.
Recommendations

 Follow up growth in 4 weeks.

## 2021-11-01 LAB — OB RESULTS CONSOLE PLATELET COUNT: Platelets: 297

## 2021-11-01 LAB — OB RESULTS CONSOLE HGB/HCT, BLOOD
HCT: 34 (ref 29–41)
Hemoglobin: 10.7

## 2021-11-04 LAB — OB RESULTS CONSOLE RUBELLA ANTIBODY, IGM: Rubella: IMMUNE

## 2021-11-04 LAB — OB RESULTS CONSOLE RPR: RPR: NONREACTIVE

## 2021-11-04 LAB — OB RESULTS CONSOLE HIV ANTIBODY (ROUTINE TESTING): HIV: NONREACTIVE

## 2021-11-05 LAB — HEPATITIS C ANTIBODY: HCV Ab: NEGATIVE

## 2021-12-02 LAB — GLUCOSE, 2 HOUR GESTATIONAL
Glucose, Fasting: 76 (ref 60–109)
Glucose, GTT - 2 Hour: 66 (ref ?–140)

## 2021-12-31 LAB — OB RESULTS CONSOLE PLATELET COUNT: Platelets: 250

## 2021-12-31 LAB — OB RESULTS CONSOLE HGB/HCT, BLOOD
HCT: 33 (ref 29–41)
Hemoglobin: 10.4

## 2022-02-20 ENCOUNTER — Encounter: Payer: Self-pay | Admitting: Certified Nurse Midwife

## 2022-02-20 ENCOUNTER — Ambulatory Visit (INDEPENDENT_AMBULATORY_CARE_PROVIDER_SITE_OTHER): Payer: Self-pay | Admitting: Certified Nurse Midwife

## 2022-02-20 VITALS — BP 112/81 | HR 100 | Wt 147.0 lb

## 2022-02-20 DIAGNOSIS — Z3490 Encounter for supervision of normal pregnancy, unspecified, unspecified trimester: Secondary | ICD-10-CM

## 2022-02-20 DIAGNOSIS — Z3A35 35 weeks gestation of pregnancy: Secondary | ICD-10-CM

## 2022-02-20 DIAGNOSIS — Z98891 History of uterine scar from previous surgery: Secondary | ICD-10-CM

## 2022-02-20 DIAGNOSIS — Z3400 Encounter for supervision of normal first pregnancy, unspecified trimester: Secondary | ICD-10-CM

## 2022-02-20 DIAGNOSIS — O321XX0 Maternal care for breech presentation, not applicable or unspecified: Secondary | ICD-10-CM

## 2022-02-20 NOTE — Progress Notes (Signed)
History:   Sandra Thomas is a 32 y.o. G2P1001 at [redacted]w[redacted]d by LMP being seen today for her first obstetrical visit.  Her obstetrical history is significant for  an uncomplicated pregnancy with primary Cesarean section for breech presentation with uterine synechiae . Patient does intend to breast feed. Pregnancy history fully reviewed.  Patient reports no complaints.      HISTORY: OB History  Gravida Para Term Preterm AB Living  2 1 1  0 0 1  SAB IAB Ectopic Multiple Live Births  0 0 0 0 1    # Outcome Date GA Lbr Len/2nd Weight Sex Delivery Anes PTL Lv  2 Current           1 Term 05/20/20 [redacted]w[redacted]d  7 lb 3.2 oz (3.266 kg) M CS-LTranv Spinal  LIV     Name: Kimmons,BOY Chrisy     Apgar1: 8  Apgar5: 9    Pt reports pap up to date, will complete postpartum if needed.  History reviewed. No pertinent past medical history. Past Surgical History:  Procedure Laterality Date   CESAREAN SECTION N/A 05/20/2020   Procedure: CESAREAN SECTION;  Surgeon: Chancy Milroy, MD;  Location: [facility];  Service: Obstetrics;  Laterality: N/A;  Breech   Family History  Problem Relation Age of Onset   Hypertension Mother    Social History   Tobacco Use   Smoking status: Never   Smokeless tobacco: Never  Vaping Use   Vaping Use: Never used  Substance Use Topics   Alcohol use: Never   Drug use: Never   Allergies  Allergen Reactions   Prenatal Vitamins     Hives, rash   Current Outpatient Medications on File Prior to Visit  Medication Sig Dispense Refill   CALCIUM PO Take 600 mg by mouth daily.     Cholecalciferol (VITAMIN D-3) 125 MCG (5000 UT) TABS Take 5,000 Units by mouth daily.     MAGNESIUM PO Take by mouth.     Omega-3 Fatty Acids (OMEGA 3 PO) Take 2,000 Units by mouth 2 (two) times daily.     Prenatal Vit-Fe Fumarate-FA (MULTIVITAMIN-PRENATAL) 27-0.8 MG TABS tablet Take 1 tablet by mouth 2 (two) times daily. Baby and Me     No current facility-administered medications on  file prior to visit.   Review of Systems Pertinent items noted in HPI and remainder of comprehensive ROS otherwise negative.  Physical Exam:   Vitals:   02/20/22 1015  BP: 112/81  Pulse: 100  Weight: 147 lb (66.7 kg)   Fetal Heart Rate (bpm): 150  FH: 35  Bedside Ultrasound: Patient informed that the ultrasound is considered a limited obstetric ultrasound and is not intended to be a complete ultrasound exam.  Patient also informed that the ultrasound is not being completed with the intent of assessing for fetal or placental anomalies or any pelvic abnormalities.  Explained that the purpose of today's ultrasound is to assess for fetal positioning.  Patient acknowledges the purpose of the exam and the limitations of the study. Breech presentation confirmed.  Constitutional: Well-developed, well-nourished pregnant female in no acute distress.  HEENT: PERRLA Skin: normal color and turgor, no rash Cardiovascular: normal rate & rhythm,  Respiratory: normal effort,  GI: Abd soft, non-tender, gravid appropriate for gestational age MS: Extremities nontender, no edema, normal ROM Neurologic: Alert and oriented x 4.  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 Encounter for supervision of low-risk pregnancy, antepartum - Doing well, feeling regular and vigorous fetal movement   2. [redacted] weeks gestation of pregnancy - Routine OB care  - Pt declined GBS swab and GC/CT  3. History of cesarean section - Prefers rCS given breech presentation  4. Breech presentation - Head to maternal left (same as prior pregnancy), discussed ECV but counseled unlikelihood of success given she has uterine synechiae.  - Amenable to scheduling at 39wks but would like to speak to her  husband about the date.  3. Initial obstetric visit, antepartum - Prenatal records reviewed - Had IDA but received two iron infusions prior to leaving [place]. Declines CBC today. - Continue prenatal vitamins. - Problem list reviewed and updated. - Genetic Screening discussed, First trimester screen, Quad screen, and NIPS: results reviewed. - Ultrasound discussed; fetal anatomic survey: results reviewed. - Anticipatory guidance about prenatal visits given including labs, ultrasounds, and testing. - Discussed usage of Babyscripts and virtual visits as additional source of managing and completing prenatal visits in midst of coronavirus and pandemic.   - Encouraged to complete MyChart Registration for her ability to review results, send requests, and have questions addressed.  - The nature of [place] for [facility] Healthcare/Faculty Practice with multiple MDs and Advanced Practice Providers was explained to patient; also emphasized that residents, students are part of our team. - Routine obstetric precautions reviewed. Encouraged to seek out care at office or emergency room [facility] preferred) for urgent and/or emergent concerns.  Return in about 2 weeks (around 03/06/2022) for IN-PERSON, LOB.    Gaylan Gerold, MSN, CNM, [place] Certified Nurse Midwife, [facility]

## 2022-03-01 ENCOUNTER — Other Ambulatory Visit: Payer: Self-pay

## 2022-03-01 DIAGNOSIS — O321XX1 Maternal care for breech presentation, fetus 1: Secondary | ICD-10-CM

## 2022-03-01 DIAGNOSIS — Z3493 Encounter for supervision of normal pregnancy, unspecified, third trimester: Secondary | ICD-10-CM

## 2022-03-06 ENCOUNTER — Encounter: Payer: Self-pay | Admitting: Certified Nurse Midwife

## 2022-03-07 ENCOUNTER — Ambulatory Visit (INDEPENDENT_AMBULATORY_CARE_PROVIDER_SITE_OTHER): Payer: Self-pay | Admitting: Certified Nurse Midwife

## 2022-03-07 VITALS — BP 112/76 | HR 111 | Wt 151.0 lb

## 2022-03-07 DIAGNOSIS — O321XX0 Maternal care for breech presentation, not applicable or unspecified: Secondary | ICD-10-CM

## 2022-03-07 DIAGNOSIS — Z3A36 36 weeks gestation of pregnancy: Secondary | ICD-10-CM

## 2022-03-07 DIAGNOSIS — Z3493 Encounter for supervision of normal pregnancy, unspecified, third trimester: Secondary | ICD-10-CM

## 2022-03-07 DIAGNOSIS — N856 Intrauterine synechiae: Secondary | ICD-10-CM

## 2022-03-07 DIAGNOSIS — Z3A37 37 weeks gestation of pregnancy: Secondary | ICD-10-CM

## 2022-03-10 NOTE — Progress Notes (Signed)
   PRENATAL VISIT NOTE  Subjective:  [place] is a 32 y.o. G2P1001 at 49w4dbeing seen today for ongoing prenatal care.  She is currently monitored for the following issues for this low-risk pregnancy and has Uterine synechiae; Cesarean delivery delivered; Supervision of low-risk pregnancy; and Breech presentation on their problem list.  Patient reports no complaints.  Contractions: Not present. Vag. Bleeding: None.  Movement: Absent. Denies leaking of fluid.   The following portions of the patient's history were reviewed and updated as appropriate: allergies, current medications, past family history, past medical history, past social history, past surgical history and problem list.   Objective:   Vitals:   03/07/22 1017 03/07/22 1022  BP:  112/76  Pulse:  (!) 111  Weight: 151 lb (68.5 kg) 151 lb (68.5 kg)    Fetal Status: Fetal Heart Rate (bpm): 148 Fundal Height: 37 cm Movement: Absent     General:  Alert, oriented and cooperative. Patient is in no acute distress.  Skin: Skin is warm and dry. No rash noted.   Cardiovascular: Normal heart rate noted  Respiratory: Normal respiratory effort, no problems with respiration noted  Abdomen: Soft, gravid, appropriate for gestational age.  Pain/Pressure: Absent     Pelvic: Cervical exam deferred        Extremities: Normal range of motion.  Edema: None  Mental Status: Normal mood and affect. Normal behavior. Normal judgment and thought content.   Assessment and Plan:  Pregnancy: G2P1001 at 351w4d. Encounter for supervision of low-risk pregnancy in third trimester - Doing well, feeling regular and vigorous fetal movement   2. [redacted] weeks gestation of pregnancy - Routine OB care   3. Breech presentation, single or unspecified fetus - Planning rCS given unlikelihood of a successful ECV (chart reviewed with Dr. PrKennon Roundsfter prior visit) - Desires a specific date but FOB (on the phone) prefers female surgeons. [facility] and  [facility] Pre[facility] labor symptoms and general obstetric precautions including but not limited to vaginal bleeding, contractions, leaking of fluid and fetal movement were reviewed in detail with the patient. Please refer to After Visit Summary for other counseling recommendations.   Return in about 6 weeks (around 04/18/2022) for PP.  Future Appointments  Date Time Provider [place]2/26/2024 11:00 AM [facility] PAT 1 [facility]IN[facility] None   JaGabriel CarinaCNM

## 2022-03-11 ENCOUNTER — Encounter (HOSPITAL_COMMUNITY): Payer: Self-pay

## 2022-03-11 ENCOUNTER — Telehealth (HOSPITAL_COMMUNITY): Payer: Self-pay | Admitting: *Deleted

## 2022-03-11 NOTE — Pre-Procedure Instructions (Signed)
[place] interpreter number

## 2022-03-11 NOTE — Progress Notes (Signed)
Alert received through NCNotify system as follows: Patient identified as not having a recommended prenatal visit. Patient is 37 weeks and 1 days pregnant. Most recent prenatal visit was on 02/20/2022 at [place].  Chart review: Patient's last visit was on 03/07/2022.  No action needed.  Martina Sinner, RN 03/11/2022  2:08 PM

## 2022-03-11 NOTE — Patient Instructions (Addendum)
[place]  03/11/2022   Your procedure is scheduled on:  03/20/2022  Arrive at 0800 at Entrance C on [address] at [facility]  and [address]. You are invited to use the FREE valet parking or use the Visitor's parking deck.  Pick up the phone at the desk and dial [phone].  Call this number if you have problems the morning of surgery: [phone]  Remember:   Do not eat food:(After Midnight) Desps de medianoche.  Do not drink clear liquids: (After Midnight) Desps de medianoche.  Take these medicines the morning of surgery with A SIP OF WATER:  none   Do not wear jewelry, make-up or nail polish.  Do not wear lotions, powders, or perfumes. Do not wear deodorant.  Do not shave 48 hours prior to surgery.  Do not bring valuables to the hospital.  [facility] is not   responsible for any belongings or valuables brought to the hospital.  Contacts, dentures or bridgework may not be worn into surgery.  Leave suitcase in the car. After surgery it may be brought to your room.  For patients admitted to the hospital, checkout time is 11:00 AM the day of              discharge.      Please read over the following fact sheets that you were given:     Preparing for Surgery

## 2022-03-11 NOTE — Telephone Encounter (Signed)
Preadmission screen  

## 2022-03-12 ENCOUNTER — Telehealth (HOSPITAL_COMMUNITY): Payer: Self-pay | Admitting: *Deleted

## 2022-03-12 NOTE — Pre-Procedure Instructions (Signed)
Interpreter number

## 2022-03-12 NOTE — Pre-Procedure Instructions (Signed)
[phone] interpretrer number

## 2022-03-12 NOTE — Telephone Encounter (Signed)
Preadmission screen was able to reach a cousin and received an updated phone number.  Message left on new number

## 2022-03-15 ENCOUNTER — Encounter (HOSPITAL_COMMUNITY): Payer: Self-pay

## 2022-03-15 NOTE — Pre-Procedure Instructions (Signed)
Interpreter number [phone]

## 2022-03-18 ENCOUNTER — Encounter (HOSPITAL_COMMUNITY)
Admission: RE | Admit: 2022-03-18 | Discharge: 2022-03-18 | Disposition: A | Discharge status: Home / Self Care | Payer: Self-pay | Source: Ambulatory Visit | Attending: Family Medicine | Admitting: Family Medicine

## 2022-03-18 DIAGNOSIS — Z01812 Encounter for preprocedural laboratory examination: Secondary | ICD-10-CM | POA: Insufficient documentation

## 2022-03-18 DIAGNOSIS — Z3493 Encounter for supervision of normal pregnancy, unspecified, third trimester: Secondary | ICD-10-CM

## 2022-03-18 DIAGNOSIS — Z3A00 Weeks of gestation of pregnancy not specified: Secondary | ICD-10-CM | POA: Insufficient documentation

## 2022-03-18 DIAGNOSIS — O321XX0 Maternal care for breech presentation, not applicable or unspecified: Secondary | ICD-10-CM | POA: Insufficient documentation

## 2022-03-18 LAB — COMPREHENSIVE METABOLIC PANEL
ALT: 15 U/L (ref 0–44)
AST: 21 U/L (ref 15–41)
Albumin: 2.7 g/dL — ABNORMAL LOW (ref 3.5–5.0)
Alkaline Phosphatase: 159 U/L — ABNORMAL HIGH (ref 38–126)
Anion gap: 10 (ref 5–15)
BUN: 10 mg/dL (ref 6–20)
CO2: 20 mmol/L — ABNORMAL LOW (ref 22–32)
Calcium: 8.8 mg/dL — ABNORMAL LOW (ref 8.9–10.3)
Chloride: 106 mmol/L (ref 98–111)
Creatinine, Ser: 0.4 mg/dL — ABNORMAL LOW (ref 0.44–1.00)
GFR, Estimated: >60 mL/min (ref >60)
Glucose, Bld: 120 mg/dL — ABNORMAL HIGH (ref 70–99)
Potassium: 3.8 mmol/L (ref 3.5–5.1)
Sodium: 136 mmol/L (ref 135–145)
Total Bilirubin: 0.3 mg/dL (ref 0.3–1.2)
Total Protein: 6.3 g/dL — ABNORMAL LOW (ref 6.5–8.1)

## 2022-03-18 LAB — CBC
HCT: 35.3 % — ABNORMAL LOW (ref 36.0–46.0)
Hemoglobin: 11.4 g/dL — ABNORMAL LOW (ref 12.0–15.0)
MCH: 25.2 pg — ABNORMAL LOW (ref 26.0–34.0)
MCHC: 32.3 g/dL (ref 30.0–36.0)
MCV: 78.1 fL — ABNORMAL LOW (ref 80.0–100.0)
Platelets: 234 10*3/uL (ref 150–400)
RBC: 4.52 MIL/uL (ref 3.87–5.11)
RDW: 14.4 % (ref 11.5–15.5)
WBC: 10.2 10*3/uL (ref 4.0–10.5)
nRBC: 0 % (ref 0.0–0.2)

## 2022-03-18 LAB — TYPE AND SCREEN
ABO/RH(D): A POS
Antibody Screen: NEGATIVE

## 2022-03-18 LAB — RPR: RPR Ser Ql: NONREACTIVE

## 2022-03-20 ENCOUNTER — Inpatient Hospital Stay (HOSPITAL_COMMUNITY)
Admission: RE | Admit: 2022-03-20 | Discharge: 2022-03-22 | DRG: 788 | Disposition: A | Discharge status: Home / Self Care | Payer: Medicaid Other | Source: Ambulatory Visit | Attending: Obstetrics & Gynecology | Admitting: Obstetrics & Gynecology

## 2022-03-20 ENCOUNTER — Encounter (HOSPITAL_COMMUNITY)
Admission: RE | Disposition: A | Discharge status: Home / Self Care | Payer: Self-pay | Source: Ambulatory Visit | Attending: Obstetrics & Gynecology

## 2022-03-20 ENCOUNTER — Other Ambulatory Visit: Payer: Self-pay

## 2022-03-20 ENCOUNTER — Inpatient Hospital Stay (HOSPITAL_COMMUNITY): Payer: Medicaid Other | Admitting: Anesthesiology

## 2022-03-20 ENCOUNTER — Encounter (HOSPITAL_COMMUNITY): Payer: Self-pay | Admitting: Obstetrics & Gynecology

## 2022-03-20 DIAGNOSIS — O321XX0 Maternal care for breech presentation, not applicable or unspecified: Secondary | ICD-10-CM

## 2022-03-20 DIAGNOSIS — Z98891 History of uterine scar from previous surgery: Secondary | ICD-10-CM | POA: Diagnosis present

## 2022-03-20 DIAGNOSIS — O9902 Anemia complicating childbirth: Secondary | ICD-10-CM | POA: Diagnosis present

## 2022-03-20 DIAGNOSIS — Z3A39 39 weeks gestation of pregnancy: Secondary | ICD-10-CM

## 2022-03-20 DIAGNOSIS — O34219 Maternal care for unspecified type scar from previous cesarean delivery: Secondary | ICD-10-CM | POA: Diagnosis present

## 2022-03-20 DIAGNOSIS — Z23 Encounter for immunization: Secondary | ICD-10-CM | POA: Diagnosis not present

## 2022-03-20 DIAGNOSIS — O34211 Maternal care for low transverse scar from previous cesarean delivery: Principal | ICD-10-CM | POA: Diagnosis present

## 2022-03-20 DIAGNOSIS — Z3493 Encounter for supervision of normal pregnancy, unspecified, third trimester: Secondary | ICD-10-CM

## 2022-03-20 LAB — HEPATITIS B SURFACE ANTIGEN: Hepatitis B Surface Ag: NONREACTIVE

## 2022-03-20 SURGERY — Surgical Case
Anesthesia: Spinal

## 2022-03-20 MED ORDER — DIPHENHYDRAMINE HCL 25 MG PO CAPS: 25.0000 mg | ORAL_CAPSULE | ORAL | Status: DC | PRN

## 2022-03-20 MED ORDER — ZOLPIDEM TARTRATE 5 MG PO TABS: 5.0000 mg | ORAL_TABLET | Freq: Every evening | ORAL | Status: DC | PRN

## 2022-03-20 MED ORDER — PHENYLEPHRINE HCL-NACL 20-0.9 MG/250ML-% IV SOLN
INTRAVENOUS | Status: AC
  Filled 2022-03-20: qty 250

## 2022-03-20 MED ORDER — FENTANYL CITRATE (PF) 100 MCG/2ML IJ SOLN
INTRAMUSCULAR | Status: AC
  Filled 2022-03-20: qty 2

## 2022-03-20 MED ORDER — PROMETHAZINE HCL 25 MG/ML IJ SOLN: 6.2500 mg | INTRAMUSCULAR | Status: DC | PRN

## 2022-03-20 MED ORDER — ONDANSETRON HCL 4 MG/2ML IJ SOLN
INTRAMUSCULAR | Status: DC | PRN
  Administered 2022-03-20: 4 mg via INTRAVENOUS

## 2022-03-20 MED ORDER — ENOXAPARIN SODIUM 40 MG/0.4ML IJ SOSY
40.0000 mg | PREFILLED_SYRINGE | INTRAMUSCULAR | Status: DC
  Administered 2022-03-21 – 2022-03-22 (×2): 40 mg via SUBCUTANEOUS
  Filled 2022-03-20 (×3): qty 0.4

## 2022-03-20 MED ORDER — SOD CITRATE-CITRIC ACID 500-334 MG/5ML PO SOLN
ORAL | Status: AC
  Filled 2022-03-20: qty 30

## 2022-03-20 MED ORDER — ACETAMINOPHEN 10 MG/ML IV SOLN
INTRAVENOUS | Status: DC | PRN
  Administered 2022-03-20: 1000 mg via INTRAVENOUS

## 2022-03-20 MED ORDER — COCONUT OIL OIL
1.0000 | TOPICAL_OIL | Status: DC | PRN
  Administered 2022-03-22 (×2): 1 via TOPICAL

## 2022-03-20 MED ORDER — SIMETHICONE 80 MG PO CHEW
80.0000 mg | CHEWABLE_TABLET | Freq: Three times a day (TID) | ORAL | Status: DC
  Administered 2022-03-20 – 2022-03-22 (×6): 80 mg via ORAL
  Filled 2022-03-20 (×7): qty 1

## 2022-03-20 MED ORDER — MENTHOL 3 MG MT LOZG: 1.0000 | LOZENGE | OROMUCOSAL | Status: DC | PRN

## 2022-03-20 MED ORDER — OXYTOCIN-SODIUM CHLORIDE 30-0.9 UT/500ML-% IV SOLN
INTRAVENOUS | Status: AC
  Filled 2022-03-20: qty 500

## 2022-03-20 MED ORDER — NALOXONE HCL 4 MG/10ML IJ SOLN: 1.0000 ug/kg/h | INTRAVENOUS | Status: DC | PRN

## 2022-03-20 MED ORDER — OXYTOCIN-SODIUM CHLORIDE 30-0.9 UT/500ML-% IV SOLN
INTRAVENOUS | Status: DC | PRN
  Administered 2022-03-20: 400 mL via INTRAVENOUS

## 2022-03-20 MED ORDER — ACETAMINOPHEN 10 MG/ML IV SOLN
INTRAVENOUS | Status: AC
  Filled 2022-03-20: qty 100

## 2022-03-20 MED ORDER — IBUPROFEN 600 MG PO TABS
600.0000 mg | ORAL_TABLET | Freq: Four times a day (QID) | ORAL | Status: DC
  Administered 2022-03-21 – 2022-03-22 (×4): 600 mg via ORAL
  Filled 2022-03-20 (×6): qty 1

## 2022-03-20 MED ORDER — DIPHENHYDRAMINE HCL 50 MG/ML IJ SOLN: 12.5000 mg | INTRAMUSCULAR | Status: DC | PRN

## 2022-03-20 MED ORDER — SOD CITRATE-CITRIC ACID 500-334 MG/5ML PO SOLN
30.0000 mL | ORAL | Status: AC
  Administered 2022-03-20: 30 mL via ORAL

## 2022-03-20 MED ORDER — MORPHINE SULFATE (PF) 0.5 MG/ML IJ SOLN
INTRAMUSCULAR | Status: DC | PRN
  Administered 2022-03-20: .15 mg via INTRATHECAL

## 2022-03-20 MED ORDER — MEPERIDINE HCL 25 MG/ML IJ SOLN: 6.2500 mg | INTRAMUSCULAR | Status: DC | PRN

## 2022-03-20 MED ORDER — POVIDONE-IODINE 10 % EX SWAB
2.0000 | Freq: Once | CUTANEOUS | Status: AC
  Administered 2022-03-20: 2 via TOPICAL

## 2022-03-20 MED ORDER — KETOROLAC TROMETHAMINE 30 MG/ML IJ SOLN
INTRAMUSCULAR | Status: AC
  Filled 2022-03-20: qty 1

## 2022-03-20 MED ORDER — LACTATED RINGERS IV SOLN: INTRAVENOUS | Status: DC | PRN

## 2022-03-20 MED ORDER — SODIUM CHLORIDE 0.9% FLUSH: 3.0000 mL | INTRAVENOUS | Status: DC | PRN

## 2022-03-20 MED ORDER — NALOXONE HCL 0.4 MG/ML IJ SOLN: 0.4000 mg | INTRAMUSCULAR | Status: DC | PRN

## 2022-03-20 MED ORDER — SCOPOLAMINE 1 MG/3DAYS TD PT72: 1.0000 | MEDICATED_PATCH | Freq: Once | TRANSDERMAL | Status: DC

## 2022-03-20 MED ORDER — MORPHINE SULFATE (PF) 0.5 MG/ML IJ SOLN
INTRAMUSCULAR | Status: AC
  Filled 2022-03-20: qty 10

## 2022-03-20 MED ORDER — KETOROLAC TROMETHAMINE 30 MG/ML IJ SOLN
30.0000 mg | Freq: Four times a day (QID) | INTRAMUSCULAR | Status: AC
  Administered 2022-03-20 (×2): 30 mg via INTRAVENOUS
  Filled 2022-03-20 (×2): qty 1

## 2022-03-20 MED ORDER — SIMETHICONE 80 MG PO CHEW
80.0000 mg | CHEWABLE_TABLET | ORAL | Status: DC | PRN
  Administered 2022-03-22: 80 mg via ORAL
  Filled 2022-03-20: qty 1

## 2022-03-20 MED ORDER — OXYTOCIN-SODIUM CHLORIDE 30-0.9 UT/500ML-% IV SOLN: 2.5000 [IU]/h | INTRAVENOUS | Status: AC

## 2022-03-20 MED ORDER — FENTANYL CITRATE (PF) 100 MCG/2ML IJ SOLN: 25.0000 ug | INTRAMUSCULAR | Status: DC | PRN

## 2022-03-20 MED ORDER — DIPHENHYDRAMINE HCL 25 MG PO CAPS: 25.0000 mg | ORAL_CAPSULE | Freq: Four times a day (QID) | ORAL | Status: DC | PRN

## 2022-03-20 MED ORDER — ONDANSETRON HCL 4 MG/2ML IJ SOLN
INTRAMUSCULAR | Status: AC
  Filled 2022-03-20: qty 2

## 2022-03-20 MED ORDER — ACETAMINOPHEN 500 MG PO TABS
1000.0000 mg | ORAL_TABLET | Freq: Four times a day (QID) | ORAL | Status: DC
  Administered 2022-03-20 – 2022-03-22 (×6): 1000 mg via ORAL
  Filled 2022-03-20 (×8): qty 2

## 2022-03-20 MED ORDER — DIBUCAINE (PERIANAL) 1 % EX OINT: 1.0000 | TOPICAL_OINTMENT | CUTANEOUS | Status: DC | PRN

## 2022-03-20 MED ORDER — GABAPENTIN 100 MG PO CAPS
200.0000 mg | ORAL_CAPSULE | Freq: Every day | ORAL | Status: DC
  Filled 2022-03-20: qty 2

## 2022-03-20 MED ORDER — LACTATED RINGERS IV SOLN: INTRAVENOUS | Status: DC

## 2022-03-20 MED ORDER — PHENYLEPHRINE HCL-NACL 20-0.9 MG/250ML-% IV SOLN
INTRAVENOUS | Status: DC | PRN
  Administered 2022-03-20: 60 ug/min via INTRAVENOUS

## 2022-03-20 MED ORDER — FENTANYL CITRATE (PF) 100 MCG/2ML IJ SOLN
INTRAMUSCULAR | Status: DC | PRN
  Administered 2022-03-20: 15 ug via INTRATHECAL

## 2022-03-20 MED ORDER — TETANUS-DIPHTH-ACELL PERTUSSIS 5-2.5-18.5 LF-MCG/0.5 IM SUSY: 0.5000 mL | PREFILLED_SYRINGE | Freq: Once | INTRAMUSCULAR | Status: DC

## 2022-03-20 MED ORDER — SENNOSIDES-DOCUSATE SODIUM 8.6-50 MG PO TABS
2.0000 | ORAL_TABLET | Freq: Every day | ORAL | Status: DC
  Administered 2022-03-21: 2 via ORAL
  Filled 2022-03-20 (×2): qty 2

## 2022-03-20 MED ORDER — CEFAZOLIN SODIUM-DEXTROSE 2-4 GM/100ML-% IV SOLN
INTRAVENOUS | Status: AC
  Filled 2022-03-20: qty 100

## 2022-03-20 MED ORDER — AMISULPRIDE (ANTIEMETIC) 5 MG/2ML IV SOLN: 10.0000 mg | Freq: Once | INTRAVENOUS | Status: DC | PRN

## 2022-03-20 MED ORDER — PRENATAL MULTIVITAMIN CH
1.0000 | ORAL_TABLET | Freq: Every day | ORAL | Status: DC
  Administered 2022-03-21 – 2022-03-22 (×2): 1 via ORAL
  Filled 2022-03-20 (×3): qty 1

## 2022-03-20 MED ORDER — BUPIVACAINE IN DEXTROSE 0.75-8.25 % IT SOLN
INTRATHECAL | Status: DC | PRN
  Administered 2022-03-20: 1.8 mL via INTRATHECAL

## 2022-03-20 MED ORDER — WITCH HAZEL-GLYCERIN EX PADS: 1.0000 | MEDICATED_PAD | CUTANEOUS | Status: DC | PRN

## 2022-03-20 MED ORDER — CEFAZOLIN SODIUM-DEXTROSE 2-4 GM/100ML-% IV SOLN
2.0000 g | INTRAVENOUS | Status: AC
  Administered 2022-03-20: 2 g via INTRAVENOUS

## 2022-03-20 MED ORDER — ONDANSETRON HCL 4 MG/2ML IJ SOLN: 4.0000 mg | Freq: Three times a day (TID) | INTRAMUSCULAR | Status: DC | PRN

## 2022-03-20 MED ORDER — OXYCODONE HCL 5 MG PO TABS: 5.0000 mg | ORAL_TABLET | ORAL | Status: DC | PRN

## 2022-03-20 MED ORDER — DEXAMETHASONE SODIUM PHOSPHATE 10 MG/ML IJ SOLN
INTRAMUSCULAR | Status: AC
  Filled 2022-03-20: qty 1

## 2022-03-20 MED ORDER — DEXAMETHASONE SODIUM PHOSPHATE 4 MG/ML IJ SOLN
INTRAMUSCULAR | Status: DC | PRN
  Administered 2022-03-20: 10 mg via INTRAVENOUS

## 2022-03-20 MED ORDER — KETOROLAC TROMETHAMINE 30 MG/ML IJ SOLN
30.0000 mg | Freq: Once | INTRAMUSCULAR | Status: AC
  Administered 2022-03-20: 30 mg via INTRAVENOUS

## 2022-03-20 SURGICAL SUPPLY — 31 items
APL PRP STRL LF DISP 70% ISPRP (MISCELLANEOUS) ×2
BARRIER ADHS 3X4 INTERCEED (GAUZE/BANDAGES/DRESSINGS) IMPLANT
BRR ADH 4X3 ABS CNTRL BYND (GAUZE/BANDAGES/DRESSINGS)
CHLORAPREP W/TINT 26 (MISCELLANEOUS) ×2 IMPLANT
CLAMP UMBILICAL CORD (MISCELLANEOUS) ×1 IMPLANT
CLIP FILSHIE TUBAL LIGA STRL (Clip) IMPLANT
CLOTH BEACON ORANGE TIMEOUT ST (SAFETY) ×1 IMPLANT
DRSG OPSITE POSTOP 4X10 (GAUZE/BANDAGES/DRESSINGS) ×1 IMPLANT
ELECT REM PT RETURN 9FT ADLT (ELECTROSURGICAL) ×1
ELECTRODE REM PT RTRN 9FT ADLT (ELECTROSURGICAL) ×1 IMPLANT
EXTRACTOR VACUUM KIWI (MISCELLANEOUS) IMPLANT
GLOVE BIO SURGEON STRL SZ 6.5 (GLOVE) ×1 IMPLANT
GLOVE BIOGEL PI IND STRL 7.0 (GLOVE) ×2 IMPLANT
GOWN STRL REUS W/TWL LRG LVL3 (GOWN DISPOSABLE) ×2 IMPLANT
KIT ABG SYR 3ML LUER SLIP (SYRINGE) IMPLANT
NDL HYPO 25X5/8 SAFETYGLIDE (NEEDLE) IMPLANT
NEEDLE HYPO 22GX1.5 SAFETY (NEEDLE) IMPLANT
NEEDLE HYPO 25X5/8 SAFETYGLIDE (NEEDLE) IMPLANT
NS IRRIG 1000ML POUR BTL (IV SOLUTION) ×1 IMPLANT
PACK C SECTION WH (CUSTOM PROCEDURE TRAY) ×1 IMPLANT
PAD OB MATERNITY 4.3X12.25 (PERSONAL CARE ITEMS) ×1 IMPLANT
RETRACTOR WND ALEXIS 25 LRG (MISCELLANEOUS) IMPLANT
RTRCTR WOUND ALEXIS 25CM LRG (MISCELLANEOUS)
SUT VIC AB 0 CT1 36 (SUTURE) ×6 IMPLANT
SUT VIC AB 2-0 CT1 27 (SUTURE) ×1
SUT VIC AB 2-0 CT1 TAPERPNT 27 (SUTURE) ×1 IMPLANT
SUT VIC AB 4-0 PS2 27 (SUTURE) ×1 IMPLANT
SYR CONTROL 10ML LL (SYRINGE) IMPLANT
TOWEL OR 17X24 6PK STRL BLUE (TOWEL DISPOSABLE) ×1 IMPLANT
TRAY FOLEY W/BAG SLVR 14FR LF (SET/KITS/TRAYS/PACK) IMPLANT
WATER STERILE IRR 1000ML POUR (IV SOLUTION) ×1 IMPLANT

## 2022-03-20 NOTE — Transfer of Care (Signed)
Immediate Anesthesia Transfer of Care Note  Patient: Sandra Thomas  Procedure(s) Performed: CESAREAN SECTION  Patient Location: PACU  Anesthesia Type:Spinal  Level of Consciousness: awake, alert , and patient cooperative  Airway & Oxygen Therapy: Patient Spontanous Breathing  Post-op Assessment: Report given to RN and Post -op Vital signs reviewed and stable  Post vital signs: Reviewed and stable  Last Vitals:  Vitals Value Taken Time  BP 114/68 03/20/22 1219  Temp    Pulse 93 03/20/22 1225  Resp 16 03/20/22 1225  SpO2 97 % 03/20/22 1225  Vitals shown include unvalidated device data.  Last Pain:  Vitals:   03/20/22 0857  TempSrc: Oral      Patients Stated Pain Goal: 5 ([id number])  Complications: No notable events documented.

## 2022-03-20 NOTE — H&P (Signed)
Sandra Thomas is a 32 y.o. female presenting for repeat cesarean section for breech presentation. Prenatal care was primarily in [place]. She was seen once at [facility]. Plans to use condoms for BC OB History     Gravida  2   Para  1   Term  1   Preterm  0   AB  0   Living  1      SAB  0   IAB  0   Ectopic  0   Multiple  0   Live Births  1          History reviewed. No pertinent past medical history. Past Surgical History:  Procedure Laterality Date   CESAREAN SECTION N/A 05/20/2020   Procedure: CESAREAN SECTION;  Surgeon: Chancy Milroy, MD;  Location: [facility] LD [facility]S;  Service: Obstetrics;  Laterality: N/A;  Breech   Family History: family history includes Hypertension in her mother; Hypothyroidism in her mother. Social History:  reports that she has never smoked. She has never used smokeless tobacco. She reports that she does not drink alcohol and does not use drugs.     Maternal Diabetes: No Genetic Screening: Normal Maternal Ultrasounds/Referrals: Normal Fetal Ultrasounds or other Referrals:  None Maternal Substance Abuse:  No Significant Maternal Medications:  None Significant Maternal Lab Results:  None Number of Prenatal Visits:Less than or equal to 3 verified prenatal visits Other Comments:   prenatal care in [place] until 20 weeks  Review of Systems Maternal Medical History:  Reason for admission: Elective repeat cesarean section at 39 weeks  Fetal activity: Perceived fetal activity is normal.   Prenatal complications: no prenatal complications Prenatal Complications - Diabetes: none.     Blood pressure 112/85, pulse 95, temperature 98 F (36.7 C), temperature source Oral, resp. rate 20, height 5' 4.57" (1.64 m), weight 69 kg, last menstrual period 06/20/2021, SpO2 100 %, currently breastfeeding. Maternal Exam:  Uterine Assessment: Contraction frequency is rare.  Abdomen: Patient reports no abdominal tenderness. Surgical scars: low transverse.    Fetal presentation: breech Introitus: not evaluated.   Cervix: not evaluated.   Physical Exam Vitals and nursing note reviewed. Exam conducted with a chaperone present.  Constitutional:      Appearance: Normal appearance.  HENT:     Head: Normocephalic and atraumatic.  Eyes:     Pupils: Pupils are equal, round, and reactive to light.  Cardiovascular:     Rate and Rhythm: Normal rate.  Pulmonary:     Effort: Pulmonary effort is normal.  Musculoskeletal:     Cervical back: Normal range of motion.  Skin:    General: Skin is warm and dry.  Neurological:     General: No focal deficit present.     Mental Status: She is alert.  Psychiatric:        Mood and Affect: Mood normal.     Prenatal labs: ABO, Rh: --/--/A POS (02/26 1127) Antibody: NEG (02/26 1127) Rubella: Immune (10/15 1319) RPR: NON REACTIVE (02/26 1113)  HBsAg:    HIV: Non-reactive (10/15 1319)  GBS:     Assessment/Plan: Breech presentation confirmed with [place] at bedside. The risks of surgery were discussed with the patient including but were not limited to: bleeding which may require transfusion or reoperation; infection which may require antibiotics; injury to bowel, bladder, ureters or other surrounding organs; injury to the fetus; need for additional procedures including hysterectomy in the event of a life-threatening hemorrhage; formation of adhesions; placental abnormalities with subsequent pregnancies;  incisional problems; thromboembolic phenomenon and other postoperative/anesthesia complications.  The patient concurred with the proposed plan, giving informed written consent for the procedure.   Patient has been NPO since 0000 she will remain NPO for procedure. Anesthesia and OR aware. Preoperative prophylactic antibiotics and SCDs ordered on call to the OR.  To OR when ready.    Emeterio Reeve 03/20/2022, 10:32 AM

## 2022-03-20 NOTE — Progress Notes (Signed)
OBOR charge called patient at [place] on 03/20/22 for her pre-op but no answer.

## 2022-03-20 NOTE — Anesthesia Preprocedure Evaluation (Addendum)
Anesthesia Evaluation  Patient identified by MRN, date of birth, ID band Patient awake    Reviewed: Allergy & Precautions, NPO status , Patient's Chart, lab work & pertinent test results  History of Anesthesia Complications Negative for: history of anesthetic complications  Airway Mallampati: II  TM Distance: >3 FB Neck ROM: Full    Dental no notable dental hx. (+) Dental Advisory Given   Pulmonary neg pulmonary ROS   Pulmonary exam normal        Cardiovascular negative cardio ROS Normal cardiovascular exam     Neuro/Psych negative neurological ROS     GI/Hepatic negative GI ROS, Neg liver ROS,,,  Endo/Other  negative endocrine ROS    Renal/GU negative Renal ROS     Musculoskeletal negative musculoskeletal ROS (+)    Abdominal   Peds  Hematology negative hematology ROS (+)   Anesthesia Other Findings   Reproductive/Obstetrics (+) Pregnancy                             Anesthesia Physical Anesthesia Plan  ASA: 2  Anesthesia Plan: Spinal   Post-op Pain Management: Tylenol PO (pre-op)*   Induction:   PONV Risk Score and Plan: 3 and Ondansetron, Dexamethasone and Scopolamine patch - Pre-op  Airway Management Planned: Natural Airway  Additional Equipment:   Intra-op Plan:   Post-operative Plan:   Informed Consent: I have reviewed the patients History and Physical, chart, labs and discussed the procedure including the risks, benefits and alternatives for the proposed anesthesia with the patient or authorized representative who has indicated his/her understanding and acceptance.     Dental advisory given and Interpreter used for interveiw  Plan Discussed with: Anesthesiologist and CRNA  Anesthesia Plan Comments:        Anesthesia Quick Evaluation

## 2022-03-20 NOTE — Anesthesia Postprocedure Evaluation (Signed)
Anesthesia Post Note  Patient: Sandra Thomas  Procedure(s) Performed: [place]     Patient location during evaluation: PACU Anesthesia Type: Spinal Level of consciousness: oriented and awake and alert Pain management: pain level controlled Vital Signs Assessment: post-procedure vital signs reviewed and stable Respiratory status: spontaneous breathing and respiratory function stable Cardiovascular status: blood pressure returned to baseline and stable Postop Assessment: no headache, no backache and no apparent nausea or vomiting Anesthetic complications: no  No notable events documented.  Last Vitals:  Vitals:   03/20/22 1348 03/20/22 1500  BP: 107/72 109/73  Pulse:    Resp: 18 16  Temp: 36.4 C 36.4 C  SpO2: 100% 98%    Last Pain:  Vitals:   03/20/22 1500  TempSrc: Oral  PainSc:    Pain Goal: Patients Stated Pain Goal: 4 (03/20/22 1230)                 [place]

## 2022-03-20 NOTE — Op Note (Signed)
Sandra Thomas PROCEDURE DATE: 03/20/2022  PREOPERATIVE DIAGNOSES: Intrauterine pregnancy at 62w0dweeks gestation; previous uterine incision : previous C-section X 1 ; Breech presentation POSTOPERATIVE DIAGNOSES: The same  PROCEDURE: Repeat Low Transverse Cesarean Section  SURGEON:  Dr. JEmeterio Reeve ASSISTANT:  CStormy Card MD   An experienced assistant was required given the standard of surgical care given the complexity of the case.  This assistant was needed for exposure, dissection, suctioning, retraction, instrument exchange, assisting with delivery with administration of fundal pressure, and for overall help during the procedure.  ANESTHESIOLOGIST: Dr. WLanetta Inch INDICATIONS: Sandra SOliviana Meredithis a 32y.o. G2P2002 at 31w0dere for repeat cesarean section secondary to the indications listed under preoperative diagnoses; please see preoperative note for further details.  The risks of surgery were discussed with the patient including but were not limited to: bleeding which may require transfusion or reoperation; infection which may require antibiotics; injury to bowel, bladder, ureters or other surrounding organs; injury to the fetus; need for additional procedures including hysterectomy in the event of a life-threatening hemorrhage; placental abnormalities wth subsequent pregnancies, incisional problems, thromboembolic phenomenon and other postoperative/anesthesia complications. The patient concurred with the proposed plan, giving informed written consent for the procedures.    FINDINGS:  Viable female infant in complete breech presentation.  Apgars 8 and 9.  Light meconium stained amniotic fluid.  Intact placenta, three vessel cord.  Normal uterus, fallopian tubes and ovaries bilaterally.   ANESTHESIA: Spinal INTRAVENOUS FLUIDS: 1200 ml ESTIMATED BLOOD LOSS: 276 ml URINE OUTPUT:  450 ml SPECIMENS: Placenta sent to L&D COMPLICATIONS: None immediate  PROCEDURE  IN DETAIL:  The patient preoperatively received intravenous antibiotics and had sequential compression devices applied to her lower extremities.   She was then taken to the operating room where spinal anesthesia was administered and was found to be adequate. She was then placed in a dorsal supine position with a leftward tilt, and prepped and draped in a sterile manner.  A foley catheter was placed into her bladder and attached to constant gravity.  After an adequate timeout was performed, a Pfannenstiel skin incision was made with scalpel over her preexisting scar and carried through to the underlying layer of fascia. The fascia was incised in the midline, and this incision was extended bluntly. The underlying rectus muscles were dissected off the fascia bluntly, after which the peritoneum was entered bluntly. Attention was turned to the lower uterine segment where a low transverse hysterotomy was made with a scalpel and extended bilaterally bluntly.  The infant was successfully delivered, the cord was clamped and cut after one minute, and the infant was handed over to awaiting neonatology team. Uterine massage was then administered, and the placenta delivered intact with a three-vessel cord. The uterus was then cleared of clot and debris.  The hysterotomy was closed with 0 Vicryl in a single layer running locked fashion. Figure-of-eight 0 Vicryl serosal stitches were placed to help with hemostasis. The pelvis was cleared of all clot and debris. Hemostasis was confirmed on all surfaces.  The peritoneum was reapproximated using 2-0 Vicryl purse string stitch. The fascia was then closed using 0 Vicryl in a running fashion. The skin was closed with a 4-0 Vicryl subcuticular stitch. The patient tolerated the procedure well. Sponge, lap, instrument and needle counts were correct x 3.  She was taken to the recovery room in stable condition.   ChLiliane ChannelD MPH OB Fellow, [facility] 03/20/2022

## 2022-03-20 NOTE — Anesthesia Procedure Notes (Signed)
Spinal  Patient location during procedure: OR Start time: 03/20/2022 11:00 AM End time: 03/20/2022 11:04 AM Reason for block: surgical anesthesia Staffing Performed: anesthesiologist  Anesthesiologist: Freddrick March, MD Performed by: Freddrick March, MD Authorized by: Freddrick March, MD   Preanesthetic Checklist Completed: patient identified, IV checked, risks and benefits discussed, surgical consent, monitors and equipment checked, pre-op evaluation and timeout performed Spinal Block Patient position: sitting Prep: DuraPrep and site prepped and draped Patient monitoring: cardiac monitor, continuous pulse ox and blood pressure Approach: midline Location: L3-4 Injection technique: single-shot Needle Needle type: Pencan  Needle gauge: 24 G Needle length: 9 cm Assessment Sensory level: T6 Events: CSF return Additional Notes Functioning IV was confirmed and monitors were applied. Sterile prep and drape, including hand hygiene and sterile gloves were used. The patient was positioned and the spine was prepped. The skin was anesthetized with lidocaine.  Free flow of clear CSF was obtained prior to injecting local anesthetic into the CSF.  The spinal needle aspirated freely following injection.  The needle was carefully withdrawn.  The patient tolerated the procedure well.

## 2022-03-20 NOTE — Discharge Summary (Signed)
Postpartum Discharge Summary     Patient Name: Sandra Thomas DOB: January 11, 1991 MRN: [id number]  Date of admission: 03/20/2022 Delivery date:03/20/2022  Delivering provider: Woodroe Mode  Date of discharge: 03/22/2022  Admitting diagnosis: Status post repeat low transverse cesarean section [Z98.891] Intrauterine pregnancy: [redacted]w[redacted]d    Secondary diagnosis:  Principal Problem:   Status post repeat low transverse cesarean section Active Problems:   Previous cesarean section complicating pregnancy  Additional problems: None    Discharge diagnosis: Term Pregnancy Delivered                                              Post partum procedures: NA Augmentation: N/A Complications: None  Hospital course: Sceduled C/S   32y.o. yo G2P2002 at 315w0das admitted to the hospital 03/20/2022 for scheduled cesarean section with the following indication: previous C-section and Malpresentation- breech. Delivery details are as follows:  Membrane Rupture Time/Date: 11:34 AM ,03/20/2022   Delivery Method:C-Section, Low Transverse  Details of operation can be found in separate operative note.  Patient had a postpartum course complicated by nothing.  She is ambulating, tolerating a regular diet, passing flatus, and urinating well. Patient is discharged home in stable condition on  03/22/22        Newborn Data: Birth date:03/20/2022  Birth time:11:36 AM  Gender:Female  Living status:Living  Apgars:8 ,9  Weight:3540 g     Magnesium Sulfate received: No BMZ received: No Rhophylac:N/A MMR:N/A, Rubella Immune T-DaP: declined Flu: N/A Transfusion:No  Physical exam  Vitals:   03/21/22 0500 03/21/22 1646 03/21/22 2138 03/22/22 0536  BP: 104/68 (!) 104/54 113/77 117/84  Pulse: 86 81 90   Resp: '16 18 16 16  '$ Temp: 98.4 F (36.9 C) 98.1 F (36.7 C) 98.1 F (36.7 C) 98 F (36.7 C)  TempSrc: Oral Oral Oral   SpO2: 98%  98% 99%  Weight:      Height:       General: alert, cooperative, and  no distress Lochia: appropriate Uterine Fundus: firm Incision: Healing well with no significant drainage, scant old blood on dressing.  DVT Evaluation: No evidence of DVT seen on physical exam. Labs: Lab Results  Component Value Date   WBC 13.2 (H) 03/21/2022   HGB 10.2 (L) 03/21/2022   HCT 31.6 (L) 03/21/2022   MCV 78.6 (L) 03/21/2022   PLT 217 03/21/2022      Latest Ref Rng & Units 03/18/2022   11:13 AM  CMP  Glucose 70 - 99 mg/dL 120   BUN 6 - 20 mg/dL 10   Creatinine 0.44 - 1.00 mg/dL 0.40   Sodium 135 - 145 mmol/L 136   Potassium 3.5 - 5.1 mmol/L 3.8   Chloride 98 - 111 mmol/L 106   CO2 22 - 32 mmol/L 20   Calcium 8.9 - 10.3 mg/dL 8.8   Total Protein 6.5 - 8.1 g/dL 6.3   Total Bilirubin 0.3 - 1.2 mg/dL 0.3   Alkaline Phos 38 - 126 U/L 159   AST 15 - 41 U/L 21   ALT 0 - 44 U/L 15    Edinburgh Score:    03/21/2022    8:50 AM  Edinburgh Postnatal Depression Scale Screening Tool  I have been able to laugh and see the funny side of things. 0  I have looked forward with enjoyment to things.  1  I have blamed myself unnecessarily when things went wrong. 2  I have been anxious or worried for no good reason. 1  I have felt scared or panicky for no good reason. 0  Things have been getting on top of me. 0  I have been so unhappy that I have had difficulty sleeping. 0  I have felt sad or miserable. 0  I have been so unhappy that I have been crying. 1  The thought of harming myself has occurred to me. 0  Edinburgh Postnatal Depression Scale Total 5     After visit meds:  Allergies as of 03/22/2022   No Known Allergies      Medication List     TAKE these medications    acetaminophen 500 MG tablet Commonly known as: TYLENOL Take 2 tablets (1,000 mg total) by mouth every 6 (six) hours as needed.   CALCIUM 500 PO Take 500 mg by mouth 2 (two) times daily.   coconut oil Oil Apply 1 Application topically as needed.   ibuprofen 600 MG tablet Commonly known as:  ADVIL Take 1 tablet (600 mg total) by mouth every 6 (six) hours as needed.   oxyCODONE 5 MG immediate release tablet Commonly known as: Oxy IR/ROXICODONE Take 1 tablet (5 mg total) by mouth every 4 (four) hours as needed for breakthrough pain or severe pain.   polyethylene glycol powder 17 GM/SCOOP powder Commonly known as: GLYCOLAX/MIRALAX Take 17 g by mouth daily as needed for moderate constipation.   PRENATAL PO Take 1 capsule by mouth daily.   Vitamin D-3 125 MCG (5000 UT) Tabs Take 5,000 Units by mouth daily.         Discharge home in stable condition Infant Feeding: Breast Infant Disposition:home with mother Discharge instruction: per After Visit Summary and Postpartum booklet. Activity: Advance as tolerated. Pelvic rest for 6 weeks.  Diet: routine diet Future Appointments: Future Appointments  Date Time Provider [place]  03/27/2022 10:20 AM [facility] NURSE [facility]  04/25/2022 10:55 AM Woodroe Mode, MD [facility]   Follow up Visit:  [place] for [place] at [facility] Follow up.   Specialty: Obstetrics and Gynecology Why: Incision check in 1 week. Postpartum appointment in 6 weeks.  Call or message the office as needed before appointments Contact information: [address] [place] [phone] [place] [facility] Follow up.   Specialty: Obstetrics and Gynecology Why: As needed in emergencies Contact information: [address] [id number] [place] [phone]                The following message was sent to [facility] by Mikki Santee, MD  Please schedule this patient for a virtual postpartum visit in 6 weeks with the following provider: Any provider. Additional Postpartum F/U:Incision check 1 week  Low risk pregnancy complicated by:  previous C-section Delivery mode:  C-Section, Low Transverse   Anticipated Birth Control:   None   03/22/2022 Manya Silvas, CNM

## 2022-03-21 LAB — CBC
HCT: 31.6 % — ABNORMAL LOW (ref 36.0–46.0)
Hemoglobin: 10.2 g/dL — ABNORMAL LOW (ref 12.0–15.0)
MCH: 25.4 pg — ABNORMAL LOW (ref 26.0–34.0)
MCHC: 32.3 g/dL (ref 30.0–36.0)
MCV: 78.6 fL — ABNORMAL LOW (ref 80.0–100.0)
Platelets: 217 10*3/uL (ref 150–400)
RBC: 4.02 MIL/uL (ref 3.87–5.11)
RDW: 14.3 % (ref 11.5–15.5)
WBC: 13.2 10*3/uL — ABNORMAL HIGH (ref 4.0–10.5)
nRBC: 0 % (ref 0.0–0.2)

## 2022-03-21 NOTE — Lactation Note (Signed)
This note was copied from a baby's chart. Lactation Consultation Note  Patient Name: Sandra Thomas [id number] Date: 03/21/2022 Age:31 hours Reason for consult: Follow-up assessment  Arabic interpreter used via video. P2,  Baby demonstrated feeding cues.  Mother attempted to latch baby in cradle hold.  Placed pillow under baby and repositioned hold to cross cradle for head support.  Baby opened wide and latched with ease.  Intermittent swallows observed.  Feed on demand with cues.  Goal 8-12+ times per day after first 24 hrs.  Place baby STS if not cueing. Answered questions regarding feed frequency.  Maternal Data Has patient been taught Hand Expression?: Yes Does the patient have breastfeeding experience prior to this delivery?: Yes How long did the patient breastfeed?: 6 mos.  Feeding Mother's Current Feeding Choice: Breast Milk  LATCH Score Latch: Grasps breast easily, tongue down, lips flanged, rhythmical sucking.  Audible Swallowing: A few with stimulation  Type of Nipple: Everted at rest and after stimulation  Comfort (Breast/Nipple): Soft / non-tender  Hold (Positioning): Assistance needed to correctly position infant at breast and maintain latch.  LATCH Score: 8 Interventions Interventions: Breast feeding basics reviewed;Assisted with latch;Support pillows;Adjust position;Education;LC Services brochure Consult Status Consult Status: Follow-up Date: 03/22/22 Follow-up type: In-patient    Vivianne Master [facility] 03/21/2022, 9:48 AM

## 2022-03-21 NOTE — Progress Notes (Signed)
Subjective: Postpartum Day 1 s/p Cesarean Delivery for for breech presentation. Pt would like to wait until husband comes in to discuss birth control options.  Patient reports + flatus, + BM, and no problems voiding.    Objective: Vital signs in last 24 hours: Temp:  [97.6 F (36.4 C)-98.8 F (37.1 C)] 98.4 F (36.9 C) (02/29 0500) Pulse Rate:  [77-95] 86 (02/29 0500) Resp:  [15-24] 16 (02/29 0500) BP: (104-116)/(59-86) 104/68 (02/29 0500) SpO2:  [97 %-100 %] 98 % (02/29 0500) Weight:  YF:[id number] kg] 69 kg (02/28 0857)  Physical Exam:  General: fatigued and no distress Lochia: appropriate Uterine Fundus: soft Incision: healing well, no significant drainage, no significant erythema DVT Evaluation: No evidence of DVT seen on physical exam.  Recent Labs    03/18/22 1113 03/21/22 0424  HGB 11.4* 10.2*  HCT 35.3* 31.6*    Assessment/Plan: Status post Cesarean section. Doing well postoperatively.  Continue current care.  Contraception: undecided     Marcelina Morel, [place] 03/21/2022, 7:41 AM

## 2022-03-22 MED ORDER — POLYETHYLENE GLYCOL 3350 17 GM/SCOOP PO POWD: 17.0000 g | Freq: Every day | ORAL | 2 refills | Status: DC | PRN

## 2022-03-22 MED ORDER — OXYCODONE HCL 5 MG PO TABS: 5.0000 mg | ORAL_TABLET | ORAL | 0 refills | Status: DC | PRN

## 2022-03-22 MED ORDER — TETANUS-DIPHTH-ACELL PERTUSSIS 5-2.5-18.5 LF-MCG/0.5 IM SUSY
0.5000 mL | PREFILLED_SYRINGE | Freq: Once | INTRAMUSCULAR | Status: AC
  Administered 2022-03-22: 0.5 mL via INTRAMUSCULAR
  Filled 2022-03-22: qty 0.5

## 2022-03-22 MED ORDER — ACETAMINOPHEN 500 MG PO TABS: 1000.0000 mg | ORAL_TABLET | Freq: Four times a day (QID) | ORAL | 0 refills | Status: DC | PRN

## 2022-03-22 MED ORDER — COCONUT OIL OIL: 1.0000 | TOPICAL_OIL | 2 refills | Status: DC | PRN

## 2022-03-22 MED ORDER — IBUPROFEN 600 MG PO TABS: 600.0000 mg | ORAL_TABLET | Freq: Four times a day (QID) | ORAL | 0 refills | Status: DC | PRN

## 2022-03-22 NOTE — Lactation Note (Signed)
This note was copied from a baby's chart. Lactation Consultation Note  Patient Name: Sandra Thomas [id number] Date: 03/22/2022 Age:31 hours  Birthing parent declines [place] visit, per Lynnda Child RN   Feeding Mother's Current Feeding Choice: Breast Milk  LATCH Score No latch observed.   Consult Status Consult Status: Complete (mother declined follow up) Date: 03/22/22 Follow-up type: Call as needed    [place] 03/22/2022, 1:35 PM

## 2022-03-22 NOTE — Discharge Instructions (Signed)
Remove your dressing. On Monday March 4th. It may be easier to remove after a warm shower. You can take showers now and after the dressing has been removed. Do not take a bath or swim in a pool until 6 weeks after surgery.

## 2022-03-27 ENCOUNTER — Ambulatory Visit: Payer: Self-pay

## 2022-03-30 ENCOUNTER — Telehealth (HOSPITAL_COMMUNITY): Payer: Self-pay

## 2022-03-30 NOTE — Telephone Encounter (Signed)
Patients support person answered phone call and states that patient is asleep at this time and cannot come to the phone.   [place] [facility] [employer]   03/30/2022,1451

## 2022-04-22 ENCOUNTER — Telehealth (HOSPITAL_COMMUNITY): Payer: Self-pay

## 2022-04-22 NOTE — Telephone Encounter (Signed)
Chart review.

## 2022-04-25 ENCOUNTER — Encounter: Payer: Self-pay | Admitting: Obstetrics & Gynecology

## 2022-04-25 NOTE — Progress Notes (Signed)
    Visit was canceled by the patient

## 2022-12-04 LAB — OB RESULTS CONSOLE HGB/HCT, BLOOD
HCT: 35 (ref 29–41)
Hemoglobin: 11.3

## 2023-01-29 ENCOUNTER — Encounter: Payer: Self-pay | Admitting: Certified Nurse Midwife

## 2023-02-03 ENCOUNTER — Encounter: Payer: Self-pay | Admitting: *Deleted

## 2023-02-04 DIAGNOSIS — O0990 Supervision of high risk pregnancy, unspecified, unspecified trimester: Secondary | ICD-10-CM | POA: Insufficient documentation

## 2023-02-04 NOTE — Progress Notes (Signed)
 History:   Sandra Thomas is a 33 y.o. G3P2002 at [redacted]w[redacted]d by LMP being seen today for her first obstetrical visit with this practice. She has gotten full prenatal care in [place].  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 Desires a repeat CS . Patient does intend to breast feed. Pregnancy history fully reviewed.  Patient reports no complaints. Staying with her cousin who lives in [place], will be here until about 2 weeks after delivery. FOB is a Teacher, early years/pre in [place] and will be here on 2/5. He was present by phone and requested delivery after his arrival and with female surgeons again.   HISTORY: OB History  Gravida Para Term Preterm AB Living  3 2 2  0 0 2  SAB IAB Ectopic Multiple Live Births  0 0 0 0 2    # Outcome Date GA Lbr Len/2nd Weight Sex Type Anes PTL Lv  3 Current           2 Term 03/20/22 [redacted]w[redacted]d  7 lb 12.9 oz (3.54 kg) M CS-LTranv Spinal  LIV     Name: Ogata,BOY Hanaan     Apgar1: 8  Apgar5: 9  1 Term 05/20/20 [redacted]w[redacted]d  7 lb 3.2 oz (3.266 kg) M CS-LTranv Spinal  LIV     Name: Deam,BOY Elanah     Apgar1: 8  Apgar5: 9    History reviewed. No pertinent past medical history. Past Surgical History:  Procedure Laterality Date   CESAREAN SECTION N/A 05/20/2020   Procedure: CESAREAN SECTION;  Surgeon: Othelia Blinks, MD;  Location: [facility] LD ORS;  Service: Obstetrics;  Laterality: N/A;  Breech   CESAREAN SECTION N/A 03/20/2022   Procedure: CESAREAN SECTION;  Surgeon: Tresia Fruit, MD;  Location: [facility] LD ORS;  Service: Obstetrics;  Laterality: N/A;   Family History  Problem Relation Age of Onset   Hypertension Mother    Hypothyroidism Mother    Social History   Tobacco Use   Smoking status: Never   Smokeless tobacco: Never  Vaping Use   Vaping status: Never Used  Substance Use Topics   Alcohol use: Never   Drug use: Never   No Known Allergies Current Outpatient Medications on  File Prior to Visit  Medication Sig Dispense Refill   Calcium Carbonate (CALCIUM 500 PO) Take 500 mg by mouth 2 (two) times daily.     Cholecalciferol (VITAMIN D-3) 125 MCG (5000 UT) TABS Take 5,000 Units by mouth daily.     Omega-3 Fatty Acids (OMEGA 3 PO) Take by mouth daily.     Prenatal Vit-Fe Fumarate-FA (PRENATAL PO) Take 1 capsule by mouth daily.     No current facility-administered medications on file prior to visit.   Review of Systems Pertinent items noted in HPI and remainder of comprehensive ROS otherwise negative.  Physical Exam:   Vitals:   02/05/23 1459  BP: 115/67  Pulse: (!) 102  Weight: 148 lb 12.8 oz (67.5 kg)   Fetal Heart Rate (bpm): 130, vertex presentation!  Constitutional: Well-developed, well-nourished pregnant female in no acute distress.  HEENT: PERRLA Skin: normal color and turgor, no rash Cardiovascular: normal rate & rhythm, warm and well perfused Respiratory: normal effort, no problems with respiration noted GI: Abd soft, non-distended MS: Extremities nontender, no edema, normal ROM Neurologic: Alert and oriented x 4.  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 Supervision of high risk pregnancy in third trimester - Doing well, feeling regular and vigorous fetal movement  - Handwritten (but signed by MD) records reviewed, all normal  2. [redacted] weeks gestation of pregnancy - Routine OB care  3. History of 2 cesarean sections (Primary) - Will request CS be scheduled with Drs. Cresenzo and Willey Harrier - Initially requested CS to be scheduled at 40 weeks but amenable to just after FOB arrives due to concern for SOL and specific requests for certain surgeons. - Message sent to Dejuana Bigelow for surgery scheduling, requested  2/6  4. Initial obstetric visit in third trimester - Labs declined, wants to wait until pre-op appointment - Continue prenatal vitamins. - Problem list reviewed and updated. - Anticipatory guidance about prenatal visits given including labs, ultrasounds, and testing. - Discussed usage of Babyscripts and virtual visits as additional source of managing and completing prenatal visits in midst of coronavirus and pandemic.   - Encouraged to complete MyChart Registration for her ability to review results, send requests, and have questions addressed.  - The nature of [place] [facility]ulty Practice with multiple MDs and Advanced Practice Providers was explained to patient; also emphasized that residents, students are part of our team. - Routine obstetric precautions reviewed. Encouraged to seek out care at office or emergency room [facility]U preferred) for urgent and/or emergent concerns.  Return in about 2 weeks (around 02/19/2023) for IN-PERSON, HOB.    Future Appointments  Date Time Provider Department Center  02/19/2023  2:35 PM Derick Fleeting, CNM [facility]   Lemuel Quaker, MSN, CNM, IBCLC Certified Nurse Midwife, [facility]

## 2023-02-05 ENCOUNTER — Encounter: Payer: Self-pay | Admitting: Certified Nurse Midwife

## 2023-02-05 ENCOUNTER — Ambulatory Visit (INDEPENDENT_AMBULATORY_CARE_PROVIDER_SITE_OTHER): Payer: Self-pay | Admitting: Certified Nurse Midwife

## 2023-02-05 ENCOUNTER — Other Ambulatory Visit: Payer: Self-pay

## 2023-02-05 VITALS — BP 115/67 | HR 102 | Wt 148.8 lb

## 2023-02-05 DIAGNOSIS — Z3A36 36 weeks gestation of pregnancy: Secondary | ICD-10-CM

## 2023-02-05 DIAGNOSIS — O0933 Supervision of pregnancy with insufficient antenatal care, third trimester: Secondary | ICD-10-CM

## 2023-02-05 DIAGNOSIS — Z98891 History of uterine scar from previous surgery: Secondary | ICD-10-CM

## 2023-02-05 DIAGNOSIS — O0993 Supervision of high risk pregnancy, unspecified, third trimester: Secondary | ICD-10-CM

## 2023-02-12 ENCOUNTER — Other Ambulatory Visit: Payer: Self-pay

## 2023-02-12 ENCOUNTER — Ambulatory Visit (INDEPENDENT_AMBULATORY_CARE_PROVIDER_SITE_OTHER): Payer: Self-pay | Admitting: Certified Nurse Midwife

## 2023-02-12 ENCOUNTER — Other Ambulatory Visit (HOSPITAL_COMMUNITY)
Admission: RE | Admit: 2023-02-12 | Discharge: 2023-02-12 | Disposition: A | Discharge status: Home / Self Care | Payer: Self-pay | Source: Ambulatory Visit | Attending: Certified Nurse Midwife | Admitting: Certified Nurse Midwife

## 2023-02-12 VITALS — BP 120/82 | HR 94 | Wt 153.1 lb

## 2023-02-12 DIAGNOSIS — Z98891 History of uterine scar from previous surgery: Secondary | ICD-10-CM

## 2023-02-12 DIAGNOSIS — O0990 Supervision of high risk pregnancy, unspecified, unspecified trimester: Secondary | ICD-10-CM | POA: Insufficient documentation

## 2023-02-12 DIAGNOSIS — O0993 Supervision of high risk pregnancy, unspecified, third trimester: Secondary | ICD-10-CM

## 2023-02-12 DIAGNOSIS — Z3A37 37 weeks gestation of pregnancy: Secondary | ICD-10-CM

## 2023-02-13 LAB — CERVICOVAGINAL ANCILLARY ONLY
Chlamydia: NEGATIVE
Comment: NEGATIVE
Comment: NORMAL
Neisseria Gonorrhea: NEGATIVE

## 2023-02-13 NOTE — Progress Notes (Signed)
   PRENATAL VISIT NOTE  Subjective:  Sandra Thomas is a 33 y.o. G3P2002 at [redacted]w[redacted]d being seen today for ongoing prenatal care.  She is currently monitored for the following issues for this high-risk pregnancy and has Uterine synechiae; History of 2 cesarean sections; and Supervision of high-risk pregnancy on their problem list.  Patient reports occasional contractions and pelvic pressure .  Contractions: Irregular. Vag. Bleeding: None.  Movement: Present. Denies leaking of fluid.   The following portions of the patient's history were reviewed and updated as appropriate: allergies, current medications, past family history, past medical history, past social history, past surgical history and problem list.   Objective:   Vitals:   02/12/23 1345  BP: 120/82  Pulse: 94  Weight: 153 lb 1.6 oz (69.4 kg)   Fetal Status: Fetal Heart Rate (bpm): 135 Fundal Height: 37 cm Movement: Present  Presentation: Vertex  General:  Alert, oriented and cooperative. Patient is in no acute distress.  Skin: Skin is warm and dry. No rash noted.   Cardiovascular: Normal heart rate noted  Respiratory: Normal respiratory effort, no problems with respiration noted  Abdomen: Soft, gravid, appropriate for gestational age.  Pain/Pressure: Present     Pelvic: Cervical exam deferred        Extremities: Normal range of motion.  Edema: None  Mental Status: Normal mood and affect. Normal behavior. Normal judgment and thought content.   Assessment and Plan:  Pregnancy: G3P2002 at [redacted]w[redacted]d 1. Supervision of high risk pregnancy, antepartum (Primary) - Feeling regular and vigorous fetal movement  - Culture, beta strep (group b only) - Cervicovaginal ancillary only  2. [redacted] weeks gestation of pregnancy - Routine OB care including GBS testing - Having a lot of pelvic pressure and pain, feeling contractions but nothing regular. - Advised this may be due to being this pregnant with a vertex baby for the first time,  but also advised that if her pelvic pain becomes severe, contractions become regular, or fetal movement becomes decreased to present to MAU for evaluation.  3. History of 2 Cesarean sections - Has rCS scheduled for 02/27/23  Term labor symptoms and general obstetric precautions including but not limited to vaginal bleeding, contractions, leaking of fluid and fetal movement were reviewed in detail with the patient. Please refer to After Visit Summary for other counseling recommendations.   Return in about 1 week (around 02/19/2023) for IN-PERSON, HOB.  Future Appointments  Date Time Provider Department Center  02/19/2023  2:35 PM Bernerd Limbo, CNM [facility]   Bernerd Limbo, CNM

## 2023-02-16 NOTE — Progress Notes (Unsigned)
   PRENATAL VISIT NOTE  Subjective:  Sandra Thomas is a 33 y.o. G3P2002 at [redacted]w[redacted]d being seen today for ongoing prenatal care.  She is currently monitored for the following issues for this {Blank single:19197::"high-risk","low-risk"} pregnancy and has Uterine synechiae; History of 2 cesarean sections; and Supervision of high-risk pregnancy on their problem list.  Patient reports {sx:[id number]}.   .  .   . Denies leaking of fluid.   The following portions of the patient's history were reviewed and updated as appropriate: allergies, current medications, past family history, past medical history, past social history, past surgical history and problem list.   Objective:  There were no vitals filed for this visit.  Fetal Status:           General:  Alert, oriented and cooperative. Patient is in no acute distress.  Skin: Skin is warm and dry. No rash noted.   Cardiovascular: Normal heart rate noted  Respiratory: Normal respiratory effort, no problems with respiration noted  Abdomen: Soft, gravid, appropriate for gestational age.        Pelvic: {Blank single:19197::"Cervical exam performed in the presence of a chaperone","Cervical exam deferred"}        Extremities: Normal range of motion.     Mental Status: Normal mood and affect. Normal behavior. Normal judgment and thought content.   Assessment and Plan:  Pregnancy: G3P2002 at [redacted]w[redacted]d 1. Supervision of high risk pregnancy in third trimester (Primary) ***  2. [redacted] weeks gestation of pregnancy ***  3. History of 2 cesarean sections ***  {Blank single:19197::"Term","Preterm"} labor symptoms and general obstetric precautions including but not limited to vaginal bleeding, contractions, leaking of fluid and fetal movement were reviewed in detail with the patient. Please refer to After Visit Summary for other counseling recommendations.   No follow-ups on file.  Future Appointments  Date Time Provider Department Center  02/19/2023   2:35 PM Osborne [place] [facility]  02/25/2023  9:30 AM [facility] PAT 1 [facility] None    Bernerd Limbo, CNM

## 2023-02-17 ENCOUNTER — Encounter (HOSPITAL_COMMUNITY): Payer: Self-pay

## 2023-02-17 LAB — CULTURE, BETA STREP (GROUP B ONLY): Strep Gp B Culture: NEGATIVE

## 2023-02-18 ENCOUNTER — Telehealth (HOSPITAL_COMMUNITY): Payer: Self-pay | Admitting: *Deleted

## 2023-02-18 NOTE — Telephone Encounter (Signed)
Preadmission screen

## 2023-02-19 ENCOUNTER — Ambulatory Visit: Payer: Self-pay | Admitting: Certified Nurse Midwife

## 2023-02-19 DIAGNOSIS — Z3A38 38 weeks gestation of pregnancy: Secondary | ICD-10-CM

## 2023-02-19 DIAGNOSIS — Z98891 History of uterine scar from previous surgery: Secondary | ICD-10-CM

## 2023-02-19 DIAGNOSIS — O0993 Supervision of high risk pregnancy, unspecified, third trimester: Secondary | ICD-10-CM

## 2023-02-19 NOTE — Addendum Note (Signed)
Addended by: Edd Arbour on: 02/19/2023 02:58 PM   Modules accepted: Level of Service

## 2023-02-24 ENCOUNTER — Other Ambulatory Visit: Payer: Self-pay | Admitting: Family Medicine

## 2023-02-24 ENCOUNTER — Encounter (HOSPITAL_COMMUNITY): Payer: Self-pay

## 2023-02-24 DIAGNOSIS — Z98891 History of uterine scar from previous surgery: Secondary | ICD-10-CM

## 2023-02-24 NOTE — Patient Instructions (Signed)
 Sandra Thomas  02/24/2023   Your procedure is scheduled on:  02/27/2023  Arrive at 1015 at Entrance C on [address] at [facility]  and [employer]. You are invited to use the FREE valet parking or use the Visitor's parking deck.  Pick up the phone at the desk and dial [phone].  Call this number if you have problems the morning of surgery: [phone]  Remember:   Do not eat food:(After Midnight) Desps de medianoche.  You may drink clear liquids until arrival at __1015___.  Clear liquids means a liquid you can see thru.  It can have color such as Cola or Kool aid.  Tea is OK and coffee as long as no milk or creamer of any kind.  Take these medicines the morning of surgery with A SIP OF WATER :  none   Do not wear jewelry, make-up or nail polish.  Do not wear lotions, powders, or perfumes. Do not wear deodorant.  Do not shave 48 hours prior to surgery.  Do not bring valuables to the hospital.  [facility] is not   responsible for any belongings or valuables brought to the hospital.  Contacts, dentures or bridgework may not be worn into surgery.  Leave suitcase in the car. After surgery it may be brought to your room.  For patients admitted to the hospital, checkout time is 11:00 AM the day of              discharge.      Please read over the following fact sheets that you were given:     Preparing for Surgery

## 2023-02-25 ENCOUNTER — Encounter (HOSPITAL_COMMUNITY)
Admission: RE | Admit: 2023-02-25 | Discharge: 2023-02-25 | Disposition: A | Discharge status: Home / Self Care | Payer: Self-pay | Source: Ambulatory Visit | Attending: Obstetrics & Gynecology | Admitting: Obstetrics & Gynecology

## 2023-02-25 DIAGNOSIS — Z01812 Encounter for preprocedural laboratory examination: Secondary | ICD-10-CM | POA: Insufficient documentation

## 2023-02-25 DIAGNOSIS — Z98891 History of uterine scar from previous surgery: Secondary | ICD-10-CM | POA: Insufficient documentation

## 2023-02-25 LAB — TYPE AND SCREEN
ABO/RH(D): A POS
Antibody Screen: NEGATIVE

## 2023-02-25 LAB — CBC
HCT: 37.5 % (ref 36.0–46.0)
Hemoglobin: 11.8 g/dL — ABNORMAL LOW (ref 12.0–15.0)
MCH: 24.7 pg — ABNORMAL LOW (ref 26.0–34.0)
MCHC: 31.5 g/dL (ref 30.0–36.0)
MCV: 78.5 fL — ABNORMAL LOW (ref 80.0–100.0)
Platelets: 249 10*3/uL (ref 150–400)
RBC: 4.78 MIL/uL (ref 3.87–5.11)
RDW: 14.5 % (ref 11.5–15.5)
WBC: 9.5 10*3/uL (ref 4.0–10.5)
nRBC: 0 % (ref 0.0–0.2)

## 2023-02-26 ENCOUNTER — Encounter (HOSPITAL_COMMUNITY): Payer: Self-pay | Admitting: Obstetrics & Gynecology

## 2023-02-26 LAB — RPR: RPR Ser Ql: NONREACTIVE

## 2023-02-26 NOTE — Anesthesia Preprocedure Evaluation (Addendum)
 Anesthesia Evaluation  Patient identified by MRN, date of birth, ID band Patient awake    Reviewed: Allergy & Precautions, NPO status , Patient's Chart, lab work & pertinent test results  Airway Mallampati: II  TM Distance: >3 FB Neck ROM: Full    Dental no notable dental hx. (+) Teeth Intact, Dental Advisory Given   Pulmonary neg pulmonary ROS   Pulmonary exam normal breath sounds clear to auscultation       Cardiovascular negative cardio ROS Normal cardiovascular exam Rhythm:Regular Rate:Normal     Neuro/Psych negative neurological ROS  negative psych ROS   GI/Hepatic negative GI ROS, Neg liver ROS,,,  Endo/Other  negative endocrine ROS    Renal/GU negative Renal ROS     Musculoskeletal negative musculoskeletal ROS (+)    Abdominal   Peds  Hematology Lab Results      Component                Value               Date                      WBC                      9.5                 02/25/2023                HGB                      11.8 (L)            02/25/2023                HCT                      37.5                02/25/2023                MCV                      78.5 (L)            02/25/2023                PLT                      249                 02/25/2023          TxS  A pos       Anesthesia Other Findings   Reproductive/Obstetrics (+) Pregnancy                             Anesthesia Physical Anesthesia Plan  ASA: 2  Anesthesia Plan: Spinal   Post-op Pain Management: Toradol  IV (intra-op)* and Tylenol  PO (pre-op)*   Induction:   PONV Risk Score and Plan: 3 and Treatment may vary due to age or medical condition and Ondansetron   Airway Management Planned: Natural Airway and Nasal Cannula  Additional Equipment: None  Intra-op Plan:   Post-operative Plan:   Informed Consent: I have reviewed the patients History and Physical, chart, labs and discussed the  procedure including the risks, benefits and alternatives for the  proposed anesthesia with the patient or authorized representative who has indicated his/her understanding and acceptance.     Dental advisory given and Interpreter used for interview  Plan Discussed with: CRNA and Anesthesiologist  Anesthesia Plan Comments: (39.3 wk G3P2 for  repeat C/S x3  (Arabic speaking) under spinal)        Anesthesia Quick Evaluation

## 2023-02-27 ENCOUNTER — Encounter (HOSPITAL_COMMUNITY): Payer: Self-pay | Admitting: Obstetrics & Gynecology

## 2023-02-27 ENCOUNTER — Other Ambulatory Visit: Payer: Self-pay

## 2023-02-27 ENCOUNTER — Inpatient Hospital Stay (HOSPITAL_COMMUNITY): Payer: MEDICAID | Admitting: Anesthesiology

## 2023-02-27 ENCOUNTER — Inpatient Hospital Stay (HOSPITAL_COMMUNITY): Payer: Medicaid Other | Admitting: Anesthesiology

## 2023-02-27 ENCOUNTER — Encounter (HOSPITAL_COMMUNITY)
Admission: AD | Disposition: A | Discharge status: Home / Self Care | Payer: Self-pay | Source: Home / Self Care | Attending: Family Medicine

## 2023-02-27 ENCOUNTER — Inpatient Hospital Stay (HOSPITAL_COMMUNITY)
Admission: AD | Admit: 2023-02-27 | Discharge: 2023-03-01 | DRG: 788 | Disposition: A | Discharge status: Home / Self Care | Payer: Medicaid Other | Attending: Family Medicine | Admitting: Family Medicine

## 2023-02-27 DIAGNOSIS — O0990 Supervision of high risk pregnancy, unspecified, unspecified trimester: Secondary | ICD-10-CM

## 2023-02-27 DIAGNOSIS — Z3A39 39 weeks gestation of pregnancy: Secondary | ICD-10-CM

## 2023-02-27 DIAGNOSIS — O34211 Maternal care for low transverse scar from previous cesarean delivery: Secondary | ICD-10-CM

## 2023-02-27 DIAGNOSIS — O9902 Anemia complicating childbirth: Secondary | ICD-10-CM | POA: Diagnosis present

## 2023-02-27 DIAGNOSIS — R1011 Right upper quadrant pain: Secondary | ICD-10-CM | POA: Diagnosis not present

## 2023-02-27 DIAGNOSIS — N856 Intrauterine synechiae: Secondary | ICD-10-CM | POA: Diagnosis present

## 2023-02-27 DIAGNOSIS — Z98891 History of uterine scar from previous surgery: Secondary | ICD-10-CM

## 2023-02-27 DIAGNOSIS — Z8249 Family history of ischemic heart disease and other diseases of the circulatory system: Secondary | ICD-10-CM | POA: Diagnosis not present

## 2023-02-27 LAB — HEPATITIS B SURFACE ANTIBODY,QUALITATIVE: Hep B S Ab: NONREACTIVE

## 2023-02-27 LAB — HIV ANTIBODY (ROUTINE TESTING W REFLEX): HIV Screen 4th Generation wRfx: NONREACTIVE

## 2023-02-27 SURGERY — Surgical Case
Anesthesia: Spinal

## 2023-02-27 MED ORDER — WITCH HAZEL-GLYCERIN EX PADS: 1.0000 | MEDICATED_PAD | CUTANEOUS | Status: DC | PRN

## 2023-02-27 MED ORDER — FENTANYL CITRATE (PF) 100 MCG/2ML IJ SOLN
INTRAMUSCULAR | Status: AC
  Filled 2023-02-27: qty 2

## 2023-02-27 MED ORDER — ZOLPIDEM TARTRATE 5 MG PO TABS: 5.0000 mg | ORAL_TABLET | Freq: Every evening | ORAL | Status: DC | PRN

## 2023-02-27 MED ORDER — IBUPROFEN 600 MG PO TABS
600.0000 mg | ORAL_TABLET | Freq: Four times a day (QID) | ORAL | Status: DC
  Administered 2023-02-28 – 2023-03-01 (×5): 600 mg via ORAL
  Filled 2023-02-27 (×6): qty 1

## 2023-02-27 MED ORDER — ONDANSETRON HCL 4 MG/2ML IJ SOLN: 4.0000 mg | Freq: Three times a day (TID) | INTRAMUSCULAR | Status: DC | PRN

## 2023-02-27 MED ORDER — SENNOSIDES-DOCUSATE SODIUM 8.6-50 MG PO TABS
2.0000 | ORAL_TABLET | Freq: Every day | ORAL | Status: DC
  Administered 2023-02-28 – 2023-03-01 (×2): 2 via ORAL
  Filled 2023-02-27 (×2): qty 2

## 2023-02-27 MED ORDER — ACETAMINOPHEN 10 MG/ML IV SOLN
INTRAVENOUS | Status: DC | PRN
  Administered 2023-02-27: 1000 mg via INTRAVENOUS

## 2023-02-27 MED ORDER — KETOROLAC TROMETHAMINE 30 MG/ML IJ SOLN
30.0000 mg | Freq: Four times a day (QID) | INTRAMUSCULAR | Status: AC
  Administered 2023-02-27 (×2): 30 mg via INTRAVENOUS
  Filled 2023-02-27 (×2): qty 1

## 2023-02-27 MED ORDER — DIBUCAINE (PERIANAL) 1 % EX OINT: 1.0000 | TOPICAL_OINTMENT | CUTANEOUS | Status: DC | PRN

## 2023-02-27 MED ORDER — STERILE WATER FOR IRRIGATION IR SOLN
Status: DC | PRN
  Administered 2023-02-27: 1000 mL

## 2023-02-27 MED ORDER — CEFAZOLIN SODIUM-DEXTROSE 2-4 GM/100ML-% IV SOLN
2.0000 g | INTRAVENOUS | Status: AC
  Administered 2023-02-27: 2 g via INTRAVENOUS

## 2023-02-27 MED ORDER — BUPIVACAINE IN DEXTROSE 0.75-8.25 % IT SOLN
INTRATHECAL | Status: DC | PRN
  Administered 2023-02-27: 12 mg via INTRATHECAL

## 2023-02-27 MED ORDER — CEFAZOLIN SODIUM-DEXTROSE 2-4 GM/100ML-% IV SOLN
INTRAVENOUS | Status: AC
  Filled 2023-02-27: qty 100

## 2023-02-27 MED ORDER — ONDANSETRON HCL 4 MG/2ML IJ SOLN
INTRAMUSCULAR | Status: DC | PRN
  Administered 2023-02-27: 4 mg via INTRAVENOUS

## 2023-02-27 MED ORDER — FENTANYL CITRATE (PF) 100 MCG/2ML IJ SOLN
INTRAMUSCULAR | Status: DC | PRN
  Administered 2023-02-27: 15 ug via INTRATHECAL

## 2023-02-27 MED ORDER — MENTHOL 3 MG MT LOZG: 1.0000 | LOZENGE | OROMUCOSAL | Status: DC | PRN

## 2023-02-27 MED ORDER — PHENYLEPHRINE HCL-NACL 20-0.9 MG/250ML-% IV SOLN
INTRAVENOUS | Status: DC | PRN
  Administered 2023-02-27: 60 ug/min via INTRAVENOUS

## 2023-02-27 MED ORDER — DEXMEDETOMIDINE HCL IN NACL 80 MCG/20ML IV SOLN
INTRAVENOUS | Status: AC
  Filled 2023-02-27: qty 20

## 2023-02-27 MED ORDER — SODIUM CHLORIDE 0.9% FLUSH: 3.0000 mL | INTRAVENOUS | Status: DC | PRN

## 2023-02-27 MED ORDER — DEXMEDETOMIDINE HCL IN NACL 80 MCG/20ML IV SOLN
INTRAVENOUS | Status: DC | PRN
  Administered 2023-02-27 (×3): 8 ug via INTRAVENOUS

## 2023-02-27 MED ORDER — ACETAMINOPHEN 500 MG PO TABS
1000.0000 mg | ORAL_TABLET | Freq: Four times a day (QID) | ORAL | Status: DC
  Administered 2023-02-27 – 2023-03-01 (×7): 1000 mg via ORAL
  Filled 2023-02-27 (×9): qty 2

## 2023-02-27 MED ORDER — DEXAMETHASONE SODIUM PHOSPHATE 10 MG/ML IJ SOLN
INTRAMUSCULAR | Status: DC | PRN
  Administered 2023-02-27: 10 mg via INTRAVENOUS

## 2023-02-27 MED ORDER — SIMETHICONE 80 MG PO CHEW
80.0000 mg | CHEWABLE_TABLET | Freq: Three times a day (TID) | ORAL | Status: DC
  Administered 2023-02-28 – 2023-03-01 (×5): 80 mg via ORAL
  Filled 2023-02-27 (×5): qty 1

## 2023-02-27 MED ORDER — ONDANSETRON HCL 4 MG/2ML IJ SOLN
INTRAMUSCULAR | Status: AC
  Filled 2023-02-27: qty 2

## 2023-02-27 MED ORDER — OXYTOCIN-SODIUM CHLORIDE 30-0.9 UT/500ML-% IV SOLN
INTRAVENOUS | Status: DC | PRN
  Administered 2023-02-27: 300 mL via INTRAVENOUS

## 2023-02-27 MED ORDER — MORPHINE SULFATE (PF) 0.5 MG/ML IJ SOLN
INTRAMUSCULAR | Status: AC
  Filled 2023-02-27: qty 10

## 2023-02-27 MED ORDER — LACTATED RINGERS IV SOLN: INTRAVENOUS | Status: DC

## 2023-02-27 MED ORDER — ENOXAPARIN SODIUM 40 MG/0.4ML IJ SOSY
40.0000 mg | PREFILLED_SYRINGE | INTRAMUSCULAR | Status: DC
  Administered 2023-02-28 – 2023-03-01 (×2): 40 mg via SUBCUTANEOUS
  Filled 2023-02-27 (×2): qty 0.4

## 2023-02-27 MED ORDER — TRANEXAMIC ACID-NACL 1000-0.7 MG/100ML-% IV SOLN
INTRAVENOUS | Status: DC | PRN
  Administered 2023-02-27: 1000 mg via INTRAVENOUS

## 2023-02-27 MED ORDER — PRENATAL MULTIVITAMIN CH
1.0000 | ORAL_TABLET | Freq: Every day | ORAL | Status: DC
  Administered 2023-02-28 – 2023-03-01 (×2): 1 via ORAL
  Filled 2023-02-27 (×2): qty 1

## 2023-02-27 MED ORDER — OXYCODONE HCL 5 MG PO TABS: 5.0000 mg | ORAL_TABLET | ORAL | Status: DC | PRN

## 2023-02-27 MED ORDER — POVIDONE-IODINE 10 % EX SWAB
2.0000 | Freq: Once | CUTANEOUS | Status: AC
  Administered 2023-02-27: 2 via TOPICAL

## 2023-02-27 MED ORDER — COCONUT OIL OIL: 1.0000 | TOPICAL_OIL | Status: DC | PRN

## 2023-02-27 MED ORDER — SIMETHICONE 80 MG PO CHEW: 80.0000 mg | CHEWABLE_TABLET | ORAL | Status: DC | PRN

## 2023-02-27 MED ORDER — NALOXONE HCL 0.4 MG/ML IJ SOLN: 0.4000 mg | INTRAMUSCULAR | Status: DC | PRN

## 2023-02-27 MED ORDER — DIPHENHYDRAMINE HCL 25 MG PO CAPS: 25.0000 mg | ORAL_CAPSULE | Freq: Four times a day (QID) | ORAL | Status: DC | PRN

## 2023-02-27 MED ORDER — SCOPOLAMINE 1 MG/3DAYS TD PT72: 1.0000 | MEDICATED_PATCH | Freq: Once | TRANSDERMAL | Status: DC

## 2023-02-27 MED ORDER — FENTANYL CITRATE (PF) 100 MCG/2ML IJ SOLN
INTRAMUSCULAR | Status: DC | PRN
  Administered 2023-02-27: 35 ug via INTRAVENOUS
  Administered 2023-02-27: 50 ug via INTRAVENOUS

## 2023-02-27 MED ORDER — KETOROLAC TROMETHAMINE 30 MG/ML IJ SOLN
INTRAMUSCULAR | Status: DC | PRN
  Administered 2023-02-27: 30 mg via INTRAVENOUS

## 2023-02-27 MED ORDER — MORPHINE SULFATE (PF) 0.5 MG/ML IJ SOLN
INTRAMUSCULAR | Status: DC | PRN
  Administered 2023-02-27: 150 ug via INTRATHECAL

## 2023-02-27 MED ORDER — DIPHENHYDRAMINE HCL 25 MG PO CAPS: 25.0000 mg | ORAL_CAPSULE | ORAL | Status: DC | PRN

## 2023-02-27 MED ORDER — KETOROLAC TROMETHAMINE 30 MG/ML IJ SOLN
INTRAMUSCULAR | Status: AC
  Filled 2023-02-27: qty 1

## 2023-02-27 MED ORDER — NALOXONE HCL 4 MG/10ML IJ SOLN: 1.0000 ug/kg/h | INTRAVENOUS | Status: DC | PRN

## 2023-02-27 MED ORDER — DEXAMETHASONE SODIUM PHOSPHATE 10 MG/ML IJ SOLN
INTRAMUSCULAR | Status: AC
  Filled 2023-02-27: qty 1

## 2023-02-27 MED ORDER — DIPHENHYDRAMINE HCL 50 MG/ML IJ SOLN: 12.5000 mg | INTRAMUSCULAR | Status: DC | PRN

## 2023-02-27 MED ORDER — OXYTOCIN-SODIUM CHLORIDE 30-0.9 UT/500ML-% IV SOLN: 2.5000 [IU]/h | INTRAVENOUS | Status: AC

## 2023-02-27 SURGICAL SUPPLY — 31 items
BARRIER ADHS 3X4 INTERCEED (GAUZE/BANDAGES/DRESSINGS) IMPLANT
BENZOIN TINCTURE PRP APPL 2/3 (GAUZE/BANDAGES/DRESSINGS) IMPLANT
CHLORAPREP W/TINT 26 (MISCELLANEOUS) ×2 IMPLANT
CLAMP UMBILICAL CORD (MISCELLANEOUS) ×1 IMPLANT
CLIP FILSHIE TUBAL LIGA STRL (Clip) IMPLANT
CLOTH BEACON ORANGE TIMEOUT ST (SAFETY) ×1 IMPLANT
DRSG OPSITE POSTOP 4X10 (GAUZE/BANDAGES/DRESSINGS) ×1 IMPLANT
ELECT REM PT RETURN 9FT ADLT (ELECTROSURGICAL) ×1
ELECTRODE REM PT RTRN 9FT ADLT (ELECTROSURGICAL) ×1 IMPLANT
EXTRACTOR VACUUM KIWI (MISCELLANEOUS) IMPLANT
GAUZE PAD ABD 7.5X8 STRL (GAUZE/BANDAGES/DRESSINGS) IMPLANT
GAUZE SPONGE 4X4 12PLY STRL LF (GAUZE/BANDAGES/DRESSINGS) IMPLANT
GLOVE BIO SURGEON STRL SZ 6.5 (GLOVE) ×1 IMPLANT
GLOVE BIOGEL PI IND STRL 7.0 (GLOVE) ×2 IMPLANT
GOWN STRL REUS W/TWL LRG LVL3 (GOWN DISPOSABLE) ×2 IMPLANT
KIT ABG SYR 3ML LUER SLIP (SYRINGE) IMPLANT
NDL HYPO 25X5/8 SAFETYGLIDE (NEEDLE) IMPLANT
NEEDLE HYPO 22GX1.5 SAFETY (NEEDLE) IMPLANT
NEEDLE HYPO 25X5/8 SAFETYGLIDE (NEEDLE)
NS IRRIG 1000ML POUR BTL (IV SOLUTION) ×1 IMPLANT
PACK C SECTION WH (CUSTOM PROCEDURE TRAY) ×1 IMPLANT
PAD OB MATERNITY 4.3X12.25 (PERSONAL CARE ITEMS) ×1 IMPLANT
RETRACTOR WND ALEXIS 25 LRG (MISCELLANEOUS) IMPLANT
RTRCTR WOUND ALEXIS 25CM LRG (MISCELLANEOUS)
SUT VIC AB 0 CT1 36 (SUTURE) ×6 IMPLANT
SUT VIC AB 2-0 CT1 TAPERPNT 27 (SUTURE) ×1 IMPLANT
SUT VIC AB 4-0 PS2 27 (SUTURE) ×1 IMPLANT
SYR CONTROL 10ML LL (SYRINGE) IMPLANT
TOWEL OR 17X24 6PK STRL BLUE (TOWEL DISPOSABLE) ×1 IMPLANT
TRAY FOLEY W/BAG SLVR 14FR LF (SET/KITS/TRAYS/PACK) IMPLANT
WATER STERILE IRR 1000ML POUR (IV SOLUTION) ×1 IMPLANT

## 2023-02-27 NOTE — Anesthesia Procedure Notes (Signed)
 Spinal  Patient location during procedure: OB Start time: 02/27/2023 12:02 PM End time: 02/27/2023 12:07 PM Reason for block: surgical anesthesia Staffing Performed: anesthesiologist  Anesthesiologist: Jefm Garnette LABOR, MD Performed by: Jefm Garnette LABOR, MD Authorized by: Jefm Garnette LABOR, MD   Preanesthetic Checklist Completed: patient identified, IV checked, risks and benefits discussed, surgical consent, monitors and equipment checked, pre-op evaluation and timeout performed Spinal Block Patient position: sitting Prep: DuraPrep and site prepped and draped Patient monitoring: heart rate, cardiac monitor, continuous pulse ox and blood pressure Approach: midline Location: L3-4 Injection technique: single-shot Needle Needle type: Pencan  Needle gauge: 24 G Needle length: 10 cm Needle insertion depth: 7 cm Assessment Sensory level: T4 Events: CSF return Additional Notes  1 Attempt (s). Pt tolerated procedure well.

## 2023-02-27 NOTE — Discharge Summary (Addendum)
 Postpartum Discharge Summary     Patient Name: Sandra Thomas DOB: 04-09-1990 MRN: [id number]  Date of admission: 02/27/2023 Delivery date:02/27/2023 Delivering provider: EVELINE LYNWOOD MATSU Date of discharge: 03/01/2023  Admitting diagnosis: Cesarean delivery delivered [O82] Intrauterine pregnancy: [redacted]w[redacted]d     Secondary diagnosis:  Principal Problem:   Cesarean delivery delivered Active Problems:   Uterine synechiae   History of 2 cesarean sections   Supervision of high-risk pregnancy  Additional problems: RUQ pain resolved with tylenol /ibuprofen ; RUQ US  without acute abnormality though appendix not visualized    Discharge diagnosis: Term Pregnancy Delivered                                              Post partum procedures: none Augmentation: N/A Complications: None  Hospital course: Sceduled C/S   33 y.o. yo G3P3003 at [redacted]w[redacted]d was admitted to the hospital 02/27/2023 for scheduled cesarean section with the following indication:Elective Repeat.Delivery details are as follows:  Membrane Rupture Time/Date: 12:27 PM,02/27/2023  Delivery Method:C-Section, Low Transverse Operative Delivery:N/A Details of operation can be found in separate operative note.  Patient ultimately had an uncomplicated postpartum course.  She is ambulating, tolerating a regular diet, passing flatus, and urinating well. Patient is discharged home in stable condition on  03/01/23        Newborn Data: Birth date:02/27/2023 Birth time:12:28 PM Gender:Female Living status:Living Apgars:8 ,9  Weight:7 lb 2.6 oz (3.25 kg)    Magnesium Sulfate received: No BMZ received: No Rhophylac:N/A MMR:No T-DaP: declined Flu: No RSV Vaccine received: No Transfusion:No  Immunizations received: Immunization History  Administered Date(s) Administered   Tdap 04/26/2020, 03/22/2022   Unspecified SARS-COV-2 Vaccination 03/14/2019, 07/12/2019    Physical exam  Vitals:   02/28/23 2230 03/01/23 0538 03/01/23 0738  03/01/23 1615  BP: 100/76 105/72 114/84 113/79  Pulse: 81 88 93 92  Resp: 20 20 20 19   Temp: 98.4 F (36.9 C) 98.1 F (36.7 C) 98 F (36.7 C) 98.2 F (36.8 C)  TempSrc: Oral Oral    SpO2: 97% 97% 98%   Weight:       General: alert, cooperative, and no distress Lochia: appropriate Uterine Fundus: firm Incision: Healing well with no significant drainage, Dressing is clean, dry, and intact DVT Evaluation: No evidence of DVT seen on physical exam. No cords or calf tenderness. No significant calf/ankle edema. Labs: Lab Results  Component Value Date   WBC 10.1 03/01/2023   HGB 10.4 (L) 03/01/2023   HCT 31.8 (L) 03/01/2023   MCV 76.3 (L) 03/01/2023   PLT 239 03/01/2023      Latest Ref Rng & Units 03/01/2023    8:09 AM  CMP  Glucose 70 - 99 mg/dL 83   BUN 6 - 20 mg/dL 12   Creatinine 9.55 - 1.00 mg/dL 9.47   Sodium 864 - 854 mmol/L 136   Potassium 3.5 - 5.1 mmol/L 3.8   Chloride 98 - 111 mmol/L 109   CO2 22 - 32 mmol/L 21   Calcium 8.9 - 10.3 mg/dL 8.2   Total Protein 6.5 - 8.1 g/dL 5.6   Total Bilirubin 0.0 - 1.2 mg/dL 0.5   Alkaline Phos 38 - 126 U/L 83   AST 15 - 41 U/L 20   ALT 0 - 44 U/L 15    Edinburgh Score:    02/28/2023    5:30 PM  Edinburgh Postnatal Depression Scale Screening Tool  I have been able to laugh and see the funny side of things. 0  I have looked forward with enjoyment to things. 0  I have blamed myself unnecessarily when things went wrong. 1  I have been anxious or worried for no good reason. 1  I have felt scared or panicky for no good reason. 0  Things have been getting on top of me. 0  I have been so unhappy that I have had difficulty sleeping. 0  I have felt sad or miserable. 0  I have been so unhappy that I have been crying. 0  The thought of harming myself has occurred to me. 0  Edinburgh Postnatal Depression Scale Total 2   Edinburgh Postnatal Depression Scale Total: 2   After visit meds:  Allergies as of 03/01/2023   No Known  Allergies      Medication List     TAKE these medications    acetaminophen  500 MG tablet Commonly known as: TYLENOL  Take 2 tablets (1,000 mg total) by mouth every 6 (six) hours.   ibuprofen  600 MG tablet Commonly known as: ADVIL  Take 1 tablet (600 mg total) by mouth every 6 (six) hours.   Omega 3 1000 MG Caps Take 1,000 mg by mouth daily.   oxyCODONE  5 MG immediate release tablet Commonly known as: Oxy IR/ROXICODONE  Take 1-2 tablets (5-10 mg total) by mouth every 4 (four) hours as needed for moderate pain (pain score 4-6).   PRENATAL PO Take 1 capsule by mouth daily.         Discharge home in stable condition Infant Feeding: Bottle and Breast Infant Disposition:home with mother Discharge instruction: per After Visit Summary and Postpartum booklet. Activity: Advance as tolerated. Pelvic rest for 6 weeks.  Diet: routine diet Future Appointments: Future Appointments  Date Time Provider Department Center  03/26/2023  8:35 AM Vannie Cornell SAUNDERS, CNM [facility]   Follow up Visit: Message sent   Please schedule this patient for a In person postpartum visit in 4 weeks with the following provider:  Jamilla  . Additional Postpartum F/U: NA   Low risk pregnancy complicated by:  None Delivery mode:  C-Section, Low Transverse Anticipated Birth Control:   Declines   03/01/2023 Camie DELENA Rote, CNM

## 2023-02-27 NOTE — H&P (Signed)
 Sandra Thomas is a 33 y.o. female presenting for elective repeat cesarean section, [redacted]w[redacted]d  [id number] . OB History     Gravida  3   Para  2   Term  2   Preterm  0   AB  0   Living  2      SAB  0   IAB  0   Ectopic  0   Multiple  0   Live Births  2          History reviewed. No pertinent past medical history. Past Surgical History:  Procedure Laterality Date   CESAREAN SECTION N/A 05/20/2020   Procedure: CESAREAN SECTION;  Surgeon: Lorence Ozell CROME, MD;  Location: [facility] LD ORS;  Service: Obstetrics;  Laterality: N/A;  [place]   CESAREAN SECTION N/A 03/20/2022   Procedure: CESAREAN SECTION;  Surgeon: Eveline Lynwood MATSU, MD;  Location: [facility] LD ORS;  Service: Obstetrics;  Laterality: N/A;   Family History: family history includes Hypertension in her mother; Hypothyroidism in her mother. Social History:  reports that she has never smoked. She has never used smokeless tobacco. She reports that she does not drink alcohol and does not use drugs.     Maternal Diabetes: No Genetic Screening: Declined Maternal Ultrasounds/Referrals: Normal Fetal Ultrasounds or other Referrals:uterine synechiae Maternal Substance Abuse:  No Significant Maternal Medications:  None Significant Maternal Lab Results:  Group B Strep negative Number of Prenatal Visits:greater than 3 verified prenatal visits Maternal Vaccinations: Other Comments:  None  Review of Systems Maternal Medical History:  Reason for admission: Elective repeat CS  Fetal activity: Perceived fetal activity is normal.   Prenatal Complications - Diabetes: none.     Last menstrual period 05/28/2022, currently breastfeeding. Maternal Exam:  Abdomen: Patient reports no abdominal tenderness. Surgical scars: low transverse.   Introitus: not evaluated.     Physical Exam Vitals and nursing note reviewed. Exam conducted with a chaperone present.  Constitutional:      Appearance: Normal appearance.  HENT:     Head:  Normocephalic and atraumatic.  Cardiovascular:     Rate and Rhythm: Normal rate and regular rhythm.  Pulmonary:     Effort: Pulmonary effort is normal.  Skin:    General: Skin is warm and dry.  Neurological:     General: No focal deficit present.  Psychiatric:        Mood and Affect: Mood normal.        Behavior: Behavior normal.     Prenatal labs: ABO, Rh: --/--/A POS (02/04 0941) Antibody: NEG (02/04 0941) Rubella:   RPR: NON REACTIVE (02/04 1000)  HBsAg: NON REACTIVE (02/26 1113)  HIV:    GBS: Negative/-- (01/23 1055)   Assessment/Plan: [redacted]w[redacted]d Repeat cesarean section requested. The risks of surgery were discussed with the patient including but were not limited to: bleeding which may require transfusion or reoperation; infection which may require antibiotics; injury to bowel, bladder, ureters or other surrounding organs; injury to the fetus; need for additional procedures including hysterectomy in the event of a life-threatening hemorrhage; formation of adhesions; placental abnormalities with subsequent pregnancies; incisional problems; thromboembolic phenomenon and other postoperative/anesthesia complications.  The patient concurred with the proposed plan, giving informed written consent for the procedure.   Patient has been NPO since 0000 she will remain NPO for procedure. Anesthesia and OR aware. Preoperative prophylactic antibiotics and SCDs ordered on call to the OR.  To OR when ready.    Lynwood Eveline 02/27/2023, 11:06 AM

## 2023-02-27 NOTE — Anesthesia Postprocedure Evaluation (Signed)
 Anesthesia Post Note  Patient: Sandra Thomas  Procedure(s) Performed: CESAREAN SECTION     Patient location during evaluation: Mother Baby Anesthesia Type: Spinal Level of consciousness: oriented and awake and alert Pain management: pain level controlled Vital Signs Assessment: post-procedure vital signs reviewed and stable Respiratory status: spontaneous breathing and respiratory function stable Cardiovascular status: blood pressure returned to baseline and stable Postop Assessment: no headache, no backache, no apparent nausea or vomiting and able to ambulate Anesthetic complications: no   No notable events documented.  Last Vitals:  Vitals:   02/27/23 1430 02/27/23 1454  BP: (!) 97/56 100/63  Pulse: 70 75  Resp: 17 18  Temp:  36.5 C  SpO2: 91% 99%    Last Pain:  Vitals:   02/27/23 1454  TempSrc: Oral  PainSc: 0-No pain   Pain Goal:                Epidural/Spinal Function Cutaneous sensation: Able to Wiggle Toes (02/27/23 1454), Patient able to flex knees: No (02/27/23 1454), Patient able to lift hips off bed: No (02/27/23 1454), Back pain beyond tenderness at insertion site: No (02/27/23 1454), Progressively worsening motor and/or sensory loss: No (02/27/23 1454)  Garnette DELENA Gab

## 2023-02-27 NOTE — Transfer of Care (Signed)
 Immediate Anesthesia Transfer of Care Note  Patient: Sandra Thomas  Procedure(s) Performed: CESAREAN SECTION  Patient Location: PACU  Anesthesia Type:Spinal  Level of Consciousness: awake, alert , and oriented  Airway & Oxygen Therapy: Patient Spontanous Breathing  Post-op Assessment: Report given to RN and Post -op Vital signs reviewed and stable  Post vital signs: Reviewed and stable  Last Vitals:  Vitals Value Taken Time  BP 103/64   Temp    Pulse 88   Resp 14   SpO2 97     Last Pain:  Vitals:   02/27/23 1111  TempSrc: Oral         Complications: No notable events documented.

## 2023-02-27 NOTE — Op Note (Signed)
 Cesarean Section Operative Note   Patient: Sandra Thomas  Date of Procedure: 02/27/2023  Procedure: Repeat Low Transverse Cesarean   Indications: previous uterine incision: low transverse  Pre-operative Diagnosis: previous cesarean section.   Post-operative Diagnosis: Same  TOLAC Candidate: No  Surgeon: Surgeons and Role:    * Eveline Lynwood MATSU, MD - Primary    * Ilean, Norleen GAILS, MD - Assisting  Assistants: none  An experienced assistant was required given the standard of surgical care given the complexity of the case.  This assistant was needed for exposure, dissection, suctioning, retraction, instrument exchange, assisting with delivery with administration of fundal pressure, and for overall help during the procedure.   Anesthesia: spinal  Anesthesiologist: No responsible provider has been recorded for the case.   Antibiotics: Cefazolin    Estimated Blood Loss: 200 ml   Total IV Fluids: 1100 ml  Urine Output:  200 cc OF clear urine  Specimens: None   Complications: no complications   Indications: Sandra Thomas is a 33 y.o. [phone] with an IUP [redacted]w[redacted]d presenting for scheduled cesarean secondary to the indications listed above. Clinical course notable for history of prior C-sections x 2.  The risks of cesarean section discussed with the patient included but were not limited to: bleeding which may require transfusion or reoperation; infection which may require antibiotics; injury to bowel, bladder, ureters or other surrounding organs; injury to the fetus; need for additional procedures including hysterectomy in the event of a life-threatening hemorrhage; placental abnormalities with subsequent pregnancies, incisional problems, thromboembolic phenomenon and other postoperative/anesthesia complications. The patient concurred with the proposed plan, giving informed written consent for the procedure. Patient has been NPO since last night she will remain NPO  for procedure. Anesthesia and OR aware. Preoperative prophylactic antibiotics and SCDs ordered on call to the OR.   Findings: Viable infant in cephalic presentation, no nuchal cord present. Apgars 8, 9, . Weight 3250 g. Clear amniotic fluid. Normal placenta, three vessel cord. Normal uterus, Normal bilateral fallopian tubes, Normal bilateral ovaries. No adhesive disease was encountered.  Procedure Details: A Time Out was held and the above information confirmed. The patient received intravenous antibiotics and had sequential compression devices applied to her lower extremities preoperatively. The patient was taken back to the operative suite where spinal anesthesia was administered. After induction of anesthesia, the patient was draped and prepped in the usual sterile manner and placed in a dorsal supine position with a leftward tilt. A low transverse skin incision was made with scalpel and carried down through the subcutaneous tissue to the fascia. Fascial incision was made and extended transversely. The fascia was separated from the underlying rectus tissue superiorly and inferiorly. The rectus muscles were separated in the midline bluntly and the peritoneum was entered bluntly. An Alexis retractor was placed to aid in visualization of the uterus. A bladder flap was not developed. A low transverse uterine incision was made. The infant was successfully delivered from cephalic presentation, the umbilical cord was clamped after 1 minute. Cord ph was not sent, and cord blood was obtained for evaluation. The placenta was removed Intact and appeared normal. The uterine incision was closed with a single layer running unlocked suture of 0-Monocryl. Due to ongoing bleeding from the hysterotomy figure of eight sutures of 0 Vicryl were placed after which there was excellent hemostasis. The abdomen and the pelvis were cleared of all clot and debris and the Thersia was removed. Hemostasis was confirmed on all surfaces.  The  peritoneum was reapproximated using 2-0 vicryl . The fascia was then closed using 0 Vicryl in a running fashion. The subcutaneous layer was not reapproximated. The skin was closed with a 4-0 vicryl subcuticular stitch. The patient tolerated the procedure well. Sponge, lap, instrument and needle counts were correct x 2. She was taken to the recovery room in stable condition.  Disposition: PACU - hemodynamically stable.    Signed: Norleen LULLA Rover, MD Attending Family Medicine Physician, [facility] for [facility], [facility]

## 2023-02-28 LAB — CBC
HCT: 31.8 % — ABNORMAL LOW (ref 36.0–46.0)
Hemoglobin: 10.5 g/dL — ABNORMAL LOW (ref 12.0–15.0)
MCH: 25.2 pg — ABNORMAL LOW (ref 26.0–34.0)
MCHC: 33 g/dL (ref 30.0–36.0)
MCV: 76.3 fL — ABNORMAL LOW (ref 80.0–100.0)
Platelets: 255 10*3/uL (ref 150–400)
RBC: 4.17 MIL/uL (ref 3.87–5.11)
RDW: 14.3 % (ref 11.5–15.5)
WBC: 14.9 10*3/uL — ABNORMAL HIGH (ref 4.0–10.5)
nRBC: 0 % (ref 0.0–0.2)

## 2023-02-28 NOTE — Progress Notes (Addendum)
 Subjective: POD #1: Cesarean Delivery Patient sitting on the side of the bed. She reports tolerating PO, + flatus, and no problems voiding. She reports ambulating this morning and her pain is well controlled. She denies any concerns at this time.  Objective: Vital signs in last 24 hours: Temp:  [97.6 F (36.4 C)-98.2 F (36.8 C)] 98.2 F (36.8 C) (02/07 0400) Pulse Rate:  [70-100] 85 (02/06 2316) Resp:  [14-20] 19 (02/07 0400) BP: (96-103)/(53-65) 98/64 (02/07 0400) SpO2:  [91 %-99 %] 99 % (02/06 1655)  Physical Exam:  General: alert, cooperative, and no distress Lochia: appropriate Uterine Fundus: firm Incision: Pressure dressing in place. CDI DVT Evaluation: No evidence of DVT seen on physical exam.  Recent Labs    02/28/23 0328  HGB 10.5*  HCT 31.8*    Assessment/Plan: POD #1 from repeat C/S. Doing well postoperatively.  Continue current care plan. Anticipate discharge to home tomorrow.   Sandra Thomas, Student-MidWife 02/28/2023, 12:10 PM

## 2023-02-28 NOTE — Lactation Note (Signed)
 This note was copied from a baby's chart. Lactation Consultation Note  Patient Name: Sandra Thomas Today's Date: 02/28/2023 Age:32 hours  MOB declined LC services, BF is going well.    Maternal Data    Feeding    LATCH Score                    Lactation Tools Discussed/Used    Interventions    Discharge    Consult Status      Grayce LULLA Batter 02/28/2023, 2:57 AM

## 2023-03-01 ENCOUNTER — Inpatient Hospital Stay (HOSPITAL_COMMUNITY): Payer: Medicaid Other

## 2023-03-01 LAB — COMPREHENSIVE METABOLIC PANEL
ALT: 15 U/L (ref 0–44)
AST: 20 U/L (ref 15–41)
Albumin: 2.2 g/dL — ABNORMAL LOW (ref 3.5–5.0)
Alkaline Phosphatase: 83 U/L (ref 38–126)
Anion gap: 6 (ref 5–15)
BUN: 12 mg/dL (ref 6–20)
CO2: 21 mmol/L — ABNORMAL LOW (ref 22–32)
Calcium: 8.2 mg/dL — ABNORMAL LOW (ref 8.9–10.3)
Chloride: 109 mmol/L (ref 98–111)
Creatinine, Ser: 0.52 mg/dL (ref 0.44–1.00)
GFR, Estimated: >60 mL/min (ref >60)
Glucose, Bld: 83 mg/dL (ref 70–99)
Potassium: 3.8 mmol/L (ref 3.5–5.1)
Sodium: 136 mmol/L (ref 135–145)
Total Bilirubin: 0.5 mg/dL (ref 0.0–1.2)
Total Protein: 5.6 g/dL — ABNORMAL LOW (ref 6.5–8.1)

## 2023-03-01 LAB — CBC
HCT: 31.8 % — ABNORMAL LOW (ref 36.0–46.0)
Hemoglobin: 10.4 g/dL — ABNORMAL LOW (ref 12.0–15.0)
MCH: 24.9 pg — ABNORMAL LOW (ref 26.0–34.0)
MCHC: 32.7 g/dL (ref 30.0–36.0)
MCV: 76.3 fL — ABNORMAL LOW (ref 80.0–100.0)
Platelets: 239 10*3/uL (ref 150–400)
RBC: 4.17 MIL/uL (ref 3.87–5.11)
RDW: 14.6 % (ref 11.5–15.5)
WBC: 10.1 10*3/uL (ref 4.0–10.5)
nRBC: 0 % (ref 0.0–0.2)

## 2023-03-01 MED ORDER — ACETAMINOPHEN 500 MG PO TABS
1000.0000 mg | ORAL_TABLET | Freq: Four times a day (QID) | ORAL | 0 refills | Status: AC
Start: 2023-03-01 — End: ?

## 2023-03-01 MED ORDER — OXYCODONE HCL 5 MG PO TABS: 5.0000 mg | ORAL_TABLET | ORAL | 0 refills | Status: AC | PRN

## 2023-03-01 MED ORDER — ALUM & MAG HYDROXIDE-SIMETH 200-200-20 MG/5ML PO SUSP: 30.0000 mL | ORAL | Status: DC | PRN

## 2023-03-01 MED ORDER — IBUPROFEN 600 MG PO TABS: 600.0000 mg | ORAL_TABLET | Freq: Four times a day (QID) | ORAL | 0 refills | Status: AC

## 2023-03-01 NOTE — Progress Notes (Addendum)
 POSTPARTUM PROGRESS NOTE  Subjective: Sandra Thomas is a 33 y.o. [phone] s/p elective repeat LTCS at [redacted]w[redacted]d.  She reports significant RUQ pain, worse with pressure to the area. This started approx 5AM this morning.  She denies any problems with ambulating, voiding or po intake. Denies nausea or vomiting. She has passed flatus. Pain is poorly controlled.  Lochia is appropriate.  Objective: Blood pressure 114/84, pulse 93, temperature 98 F (36.7 C), resp. rate 20, weight (P) 64.4 kg, last menstrual period 05/28/2022, SpO2 98%, currently breastfeeding.  Physical Exam:  General: alert, cooperative and no distress Chest: no respiratory distress Abdomen: soft, tender to light palpation of RUQ. No CVA tenderness bilaterally. Uterine Fundus: firm and at level of umbilicus Extremities: No calf swelling or tenderness  very minimal LE edema  Recent Labs    02/28/23 0328  HGB 10.5*  HCT 31.8*    Assessment/Plan: Sandra Thomas is a 33 y.o. [phone] s/p elective repeat CS at [redacted]w[redacted]d.  Routine Postpartum Care: Doing well, pain not controlled. Gabapentin  and oxycodone  available PRN but pt declines and prefers only tylenol /ibuprofen . -- Continue routine care, lactation support  -- Contraception: not discussed -- Feeding: breast  #new onset RUQ pain: concern for cholecystitis vs appendicitis. VSS. - stat RUQ US  - stat CBC, CMP - CTM  Dispo: Plan for discharge today if pt with improvement in pain. She strongly desires discharge today.  Lafe Domino, DO Faculty Practice, [facility] [employer] 03/01/2023 8:01 AM

## 2023-03-01 NOTE — Progress Notes (Signed)
 Pt requests dc at this time. Pain 2. Md notified

## 2023-03-01 NOTE — Clinical Social Work Maternal (Signed)
 CLINICAL SOCIAL WORK MATERNAL/CHILD NOTE  Patient Details  Name: Sandra Thomas MRN: [id number] Date of Birth: June 22, 1990  Date:  03/01/2023  Clinical Social Worker Initiating Note:  Sharyne Roulette, [place] Date/Time: Initiated:  03/01/23/1343     Child's Name:  Sandra Thomas   Biological Parents:  Mother, Father (FOB: Sandra Thomas, DOB: 09/30/1982)   Need for Interpreter:  Arabic   Reason for Referral:  Late or No Prenatal Care     Address:  [address] [place] [address]    Phone number:  [phone] (home)     Additional phone number:   Household Members/Support Persons (HM/SP):   Household Member/Support Person 1, Household Member/Support Person 2, Household Member/Support Person 3   HM/SP Name Relationship DOB or Age  HM/SP -1 Sandra Thomas FOB/Spouse 09/30/1982  HM/SP -2 Sandra Thomas Son 03/20/2022  HM/SP -3 Sandra Thomas Son 05/20/2020  HM/SP -4        HM/SP -5        HM/SP -6        HM/SP -7        HM/SP -8          Natural Supports (not living in the home):      Professional Supports: None   Employment: Unemployed   Type of Work:     Education:  Engineer, maintenance (it)   Homebound arranged:    Surveyor, Quantity Resources:  Self-Pay     Other Resources:      Cultural/Religious Considerations Which May Impact Care:  MOB speaks Sports Coach, is visiting from [place].  Strengths:  Ability to meet basic needs  , Home prepared for child  , Pediatrician chosen   Psychotropic Medications:         Pediatrician:    [place] area  Pediatrician List:   [facility]  [facility]      Pediatrician Fax Number:    Risk Factors/Current Problems:  None   Cognitive State:  Linear Thinking  , Able to Concentrate  , Alert  , Goal Oriented     Mood/Affect:  Relaxed  , Comfortable  , Calm     CSW Assessment: CSW was consulted due  to limited prenatal care. CSW met with MOB at bedside to complete assessment. When CSW entered room, MOB was observed sitting in hospital bed holding and bonding with infant. Stratus ipad used for Arabic interpretation, [place], ID# [id number]. CSW introduced self and explained reason for consult. MOB presented as calm, was agreeable to consult and remained engaged throughout encounter.   CSW confirmed address and phone number listed in chart as correct. CSW inquired about MOB's mental health history. MOB denied a history of mental health symptoms/diagnoses and denied a history of postpartum depression. MOB identified FOB as her support. MOB denied current SI/HI/DV.  CSW provided education regarding the baby blues period vs. perinatal mood disorders, discussed treatment and gave resources for mental health follow up if concerns arise.  CSW recommends self-evaluation during the postpartum time period using the New Mom Checklist from Postpartum Progress and encouraged MOB to contact a medical professional if symptoms are noted at any time.    MOB reports she has all needed items for infant, including a car seat and bassinet. MOB states she does not receive WIC or SNAP benefits and declined interest in applying for benefits. MOB declined additional resource needs.  CSW informed MOB about hospital drug screen policy due to limited prenatal care. CSW explained that infant's UDS was not collected; however infant's CDS would be monitored and a CPS report would be made if warranted. MOB expressed understanding. CSW inquired about substance use/barriers to prenatal care during pregnancy. MOB denied illicit substance use during pregnancy. MOB denied barriers to prenatal care, stating she received prenatal care in [place] prior to traveling to the [place] . MOB denied transportation barriers. MOB denied prior CPS involvement.  CSW provided review of Sudden Infant Death Syndrome (SIDS) precautions.   CSW identifies no further need  for intervention and no barriers to discharge at this time.  CSW Plan/Description:  No Further Intervention Required/No Barriers to Discharge, Sudden Infant Death Syndrome (SIDS) Education, Perinatal Mood and Anxiety Disorder (PMADs) Education, Hospital Drug Screen Policy Information, CSW Will Continue to Monitor Umbilical Cord Tissue Drug Screen Results and Make Report if Warranted    Othelia Riederer K Roschelle Calandra, LCSWA 03/01/2023, 1:49 PM

## 2023-03-01 NOTE — Progress Notes (Signed)
 POSTPARTUM PROGRESS NOTE  Subjective: Sandra Thomas is a 33 y.o. [phone] s/p elective repeat LTCS at [redacted]w[redacted]d.  She reports RUQ pain consistent with prior exam. Also questions if she could be having gas pain; she reports soft BM earlier this morning without issue.  Objective: Blood pressure 114/84, pulse 93, temperature 98 F (36.7 C), resp. rate 20, weight (P) 64.4 kg, last menstrual period 05/28/2022, SpO2 98%, currently breastfeeding.  Physical Exam:  General: alert, cooperative and no distress; sitting up in bed eating breakfast Chest: no respiratory distress Extremities: No calf swelling or tenderness  very minimal LE edema  Recent Labs    02/28/23 0328 03/01/23 0809  HGB 10.5* 10.4*  HCT 31.8* 31.8*    Assessment/Plan: Sandra Thomas is a 33 y.o. [id number] s/p elective repeat CS at [redacted]w[redacted]d.  Routine Postpartum Care: Doing well, pain well-controlled.  -- Continue routine care, lactation support  -- Contraception: not discussed -- Feeding: breast  #new onset RUQ pain: VSS. RUQ US  without evidence of abnormailty, though appendix could not be visualized. CBC with anemia consistent with prior; no leukocytosis. CMP grossly WNL, no elevated LFTs. Pt eating now and about to take tylenol /ibuprofen . - CTM - if pain improves may be candidate for discharge today per pt wishes. Discussed with pt that if pain worsens or does not improve we would recommend she stay another night for further monitoring.  Dispo: Plan for discharge as above.  Lafe Domino, DO Faculty Practice, [facility] 03/01/2023 10:22 AM

## 2023-03-04 LAB — RUBEOLA ANTIBODY, IGM: Rubeola Antibodies, IgM: NEGATIVE

## 2023-03-04 LAB — RUBEOLA ANTIBODY IGG: Rubeola IgG: <13.5 [AU]/ml — ABNORMAL LOW (ref >16.4)

## 2023-03-07 ENCOUNTER — Telehealth (HOSPITAL_COMMUNITY): Payer: Self-pay | Admitting: *Deleted

## 2023-03-07 NOTE — Telephone Encounter (Signed)
03/07/2023  Name: Sandra Thomas MRN: [id number] DOB: [birth year]  Reason for Call:  Transition of Care Hospital Discharge Call  Contact Status: Patient Contact Status: Message  Language assistant needed: Interpreter Mode: Telephonic Interpreter Interpreter Name: Peter Garter [phone]      The person who answered [phone] said she was the patient's cousin.  She gave two additional numbers to try.  There was no answer at [phone].  Interpreter left a voice mail at [phone].  Follow-Up Questions:    Inocente Salles Postnatal Depression Scale:  In the Past 7 Days:    PHQ2-9 Depression Scale:     Discharge Follow-up:    Post-discharge interventions: NA  Salena Saner, RN 03/07/2023 13:24

## 2023-03-14 ENCOUNTER — Inpatient Hospital Stay (HOSPITAL_COMMUNITY)
Admission: AD | Admit: 2023-03-14 | Discharge: 2023-03-14 | Disposition: A | Discharge status: Home / Self Care | Payer: Medicaid Other | Attending: Obstetrics & Gynecology | Admitting: Obstetrics & Gynecology

## 2023-03-14 DIAGNOSIS — B9689 Other specified bacterial agents as the cause of diseases classified elsewhere: Secondary | ICD-10-CM | POA: Diagnosis not present

## 2023-03-14 DIAGNOSIS — Y838 Other surgical procedures as the cause of abnormal reaction of the patient, or of later complication, without mention of misadventure at the time of the procedure: Secondary | ICD-10-CM | POA: Diagnosis not present

## 2023-03-14 DIAGNOSIS — L03311 Cellulitis of abdominal wall: Secondary | ICD-10-CM | POA: Insufficient documentation

## 2023-03-14 DIAGNOSIS — T8130XA Disruption of wound, unspecified, initial encounter: Secondary | ICD-10-CM

## 2023-03-14 MED ORDER — CEFADROXIL 500 MG PO CAPS: 500.0000 mg | ORAL_CAPSULE | Freq: Two times a day (BID) | ORAL | 0 refills | Status: AC

## 2023-03-14 NOTE — MAU Note (Addendum)
 Sandra Thomas is a 33 y.o. at Unknown here in MAU reporting: had c/s 2/6. Thinks the surgical area may be open.  Just want to double check it.  "Is so tired." Is breast feeding, has 2 other children. Baby is doing well. When she walks and moves around, she has pain on the right side, where it is open. Able to urinate and having BM with no problems.  Redness noted last 2 cm on incision on rt side. , slight separation of incision noted.  Provider in to assess.   Onset of complaint: 2/12, when she removed the dressing. Pain score: 7 Vitals:   03/14/23 1415  BP: 107/66  Pulse: 87  Resp: 17  Temp: 98.1 F (36.7 C)  SpO2: 99%      Lab orders placed from triage:      NT in to hold baby during exam.husband in lobby with other 2 children.  One is asleep in stroller, so pt was holding baby, "I will just lay her beside".  Explained, I can not allow it for safety.

## 2023-03-14 NOTE — MAU Provider Note (Signed)
 S: [name] is a 33 y.o. [phone] postpartum day 79 after rCS who presents to MAU today for incision evaluation.     Blood pressure 107/66, pulse 87, temperature 98.1 F (36.7 C), temperature source Oral, resp. rate 17, SpO2 99%, currently breastfeeding.   CONSTITUTIONAL: Well-developed, well-nourished female in no acute distress.  HENT:  Normocephalic, atraumatic, External right and left ear normal. Oropharynx is clear and moist EYES: Conjunctivae and EOM are normal.  No scleral icterus.  NECK: Normal range of motion, supple, no masses.  Normal thyroid.  SKIN: Skin is warm and dry. No rash noted. Not diaphoretic. No erythema. No pallor. NEUROLGIC: Alert and oriented to person, place, and time.  CARDIOVASCULAR: Normal heart rate noted RESPIRATORY: Effort and breath sounds normal, no problems with respiration noted. ABDOMEN: Soft, normal bowel sounds, no distention noted.  No tenderness, rebound or guarding. 2 cm right sided superficial wound dehiscence with erythema and serosanguinous drainage. MUSCULOSKELETAL: Normal range of motion. No tenderness.  No cyanosis, clubbing, or edema.    MDM Medium  A: Surgical site infection with superficial wound dehisence without systemic symptoms  P: Discharge home 5 day course of Cefadroxil 500 mg BID sent to preferred pharmacy Recommedned follow up in MAU 2/24 for wound check Pt traveling back to home in [place] 2/25  Patient may return to MAU as needed for worsening symptoms or systemic symptoms  Allergies as of 03/14/2023   No Active Allergies      Medication List     TAKE these medications    acetaminophen 500 MG tablet Commonly known as: TYLENOL Take 2 tablets (1,000 mg total) by mouth every 6 (six) hours.   cefadroxil 500 MG capsule Commonly known as: DURICEF Take 1 capsule (500 mg total) by mouth 2 (two) times daily.   ibuprofen 600 MG tablet Commonly known as: ADVIL Take 1 tablet (600 mg total) by mouth  every 6 (six) hours.   Omega 3 1000 MG Caps Take 1,000 mg by mouth daily.   oxyCODONE 5 MG immediate release tablet Commonly known as: Oxy IR/ROXICODONE Take 1-2 tablets (5-10 mg total) by mouth every 4 (four) hours as needed for moderate pain (pain score 4-6).   PRENATAL PO Take 1 capsule by mouth daily.         Wyn Forster, MD 03/14/2023 2:49 PM

## 2023-03-26 ENCOUNTER — Ambulatory Visit: Payer: Self-pay | Admitting: Certified Nurse Midwife
